# Patient Record
Sex: Female | Born: 1950 | Race: White | Hispanic: No | Marital: Married | Smoking: Never smoker
Health system: Southern US, Community
[De-identification: ages and names within clinical notes are randomized; demographics above are authoritative.]

## PROBLEM LIST (undated history)

## (undated) DIAGNOSIS — N189 Chronic kidney disease, unspecified: Secondary | ICD-10-CM

## (undated) DIAGNOSIS — K219 Gastro-esophageal reflux disease without esophagitis: Secondary | ICD-10-CM

## (undated) DIAGNOSIS — I10 Essential (primary) hypertension: Secondary | ICD-10-CM

## (undated) DIAGNOSIS — R202 Paresthesia of skin: Secondary | ICD-10-CM

## (undated) DIAGNOSIS — K589 Irritable bowel syndrome without diarrhea: Secondary | ICD-10-CM

## (undated) DIAGNOSIS — M5030 Other cervical disc degeneration, unspecified cervical region: Secondary | ICD-10-CM

## (undated) DIAGNOSIS — M5430 Sciatica, unspecified side: Secondary | ICD-10-CM

## (undated) DIAGNOSIS — I700 Atherosclerosis of aorta: Secondary | ICD-10-CM

## (undated) DIAGNOSIS — R413 Other amnesia: Secondary | ICD-10-CM

## (undated) DIAGNOSIS — K5790 Diverticulosis of intestine, part unspecified, without perforation or abscess without bleeding: Secondary | ICD-10-CM

## (undated) DIAGNOSIS — M51369 Other intervertebral disc degeneration, lumbar region without mention of lumbar back pain or lower extremity pain: Secondary | ICD-10-CM

## (undated) DIAGNOSIS — R42 Dizziness and giddiness: Secondary | ICD-10-CM

## (undated) DIAGNOSIS — F32A Depression, unspecified: Secondary | ICD-10-CM

## (undated) DIAGNOSIS — M5136 Other intervertebral disc degeneration, lumbar region: Secondary | ICD-10-CM

## (undated) DIAGNOSIS — E785 Hyperlipidemia, unspecified: Secondary | ICD-10-CM

## (undated) HISTORY — PX: DILATION AND CURETTAGE OF UTERUS: SHX78

## (undated) HISTORY — PX: FETAL SURGERY FOR CONGENITAL HERNIA: SHX1618

## (undated) HISTORY — DX: Other cervical disc degeneration, unspecified cervical region: M50.30

## (undated) HISTORY — DX: Gastro-esophageal reflux disease without esophagitis: K21.9

## (undated) HISTORY — DX: Chronic kidney disease, unspecified: N18.9

## (undated) HISTORY — DX: Other amnesia: R41.3

## (undated) HISTORY — DX: Essential (primary) hypertension: I10

## (undated) HISTORY — DX: Atherosclerosis of aorta: I70.0

## (undated) HISTORY — DX: Sciatica, unspecified side: M54.30

## (undated) HISTORY — DX: Dizziness and giddiness: R42

## (undated) HISTORY — DX: Other intervertebral disc degeneration, lumbar region without mention of lumbar back pain or lower extremity pain: M51.369

## (undated) HISTORY — DX: Hyperlipidemia, unspecified: E78.5

## (undated) HISTORY — DX: Paresthesia of skin: R20.2

## (undated) HISTORY — DX: Diverticulosis of intestine, part unspecified, without perforation or abscess without bleeding: K57.90

## (undated) HISTORY — DX: Other intervertebral disc degeneration, lumbar region: M51.36

## (undated) HISTORY — DX: Irritable bowel syndrome, unspecified: K58.9

## (undated) HISTORY — DX: Depression, unspecified: F32.A

---

## 2006-05-15 ENCOUNTER — Encounter: Admission: RE | Admit: 2006-05-15 | Discharge: 2006-05-15 | Payer: Self-pay | Admitting: Family Medicine

## 2007-10-15 IMAGING — US UNKNOWN US STUDY
1 series · 2 of 2 positions shown · non-contrast
Comparison: none

LEFT BREAST ULTRASOUND

LEFT BREAST ULTRASOUND:
CLINICAL DATA: Outside ultrasound at [facility]] performed on 11-14-05 described 
prominent nodular fibroglandular tissue and slight increased distortion at 7 o'clock in the left 
breast.  Report states that this was felt to represent "glandularity."  Six-month follow-up 
sonography was suggested.
On physical examination, no mass is palpated in the left lower inner quadrant.  Sonography 
demonstrates dense fibroglandular tissue with no mass, distortion or shadowing to suggest 
malignancy.

[Series 1: unknown us study · 2 of 2 slices shown]
[im 1/2]
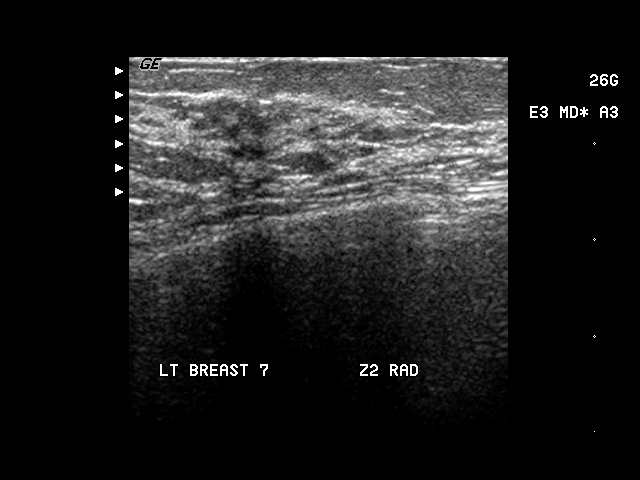
[im 2/2]
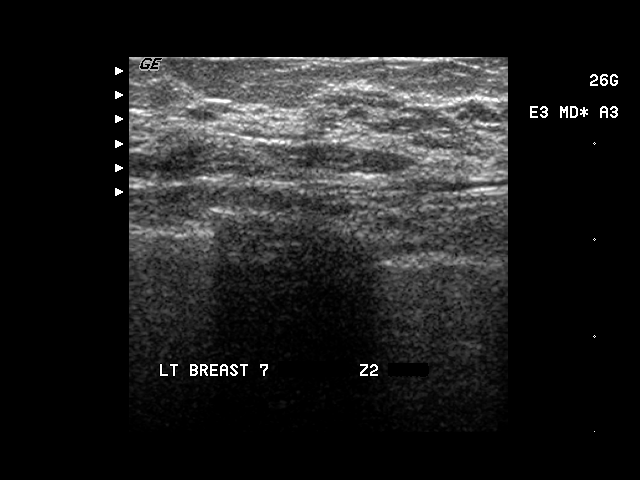

[2 of 2 positions shown; findings below may reference images not displayed]

IMPRESSION: No sonographic evidence of malignancy.  Yearly screening mammography is suggested with next 
scheduled exam in November 2006.

ASSESSMENT: Negative - BI-RADS 1

Screening mammogram of both breasts in 6 months.
,

## 2009-03-05 HISTORY — PX: APPENDECTOMY: SHX54

## 2009-08-03 ENCOUNTER — Observation Stay (HOSPITAL_COMMUNITY): Admission: EM | Admit: 2009-08-03 | Discharge: 2009-08-04 | Payer: Self-pay | Admitting: General Surgery

## 2010-05-22 LAB — CBC
Hemoglobin: 12.4 g/dL (ref 12.0–15.0)
RBC: 3.83 MIL/uL — ABNORMAL LOW (ref 3.87–5.11)
WBC: 6.1 10*3/uL (ref 4.0–10.5)

## 2010-05-22 LAB — DIFFERENTIAL
Lymphs Abs: 1.2 10*3/uL (ref 0.7–4.0)
Monocytes Relative: 11 % (ref 3–12)
Neutro Abs: 4.1 10*3/uL (ref 1.7–7.7)
Neutrophils Relative %: 67 % (ref 43–77)

## 2010-05-22 LAB — BASIC METABOLIC PANEL
Calcium: 9.2 mg/dL (ref 8.4–10.5)
Chloride: 104 mEq/L (ref 96–112)
Creatinine, Ser: 1.1 mg/dL (ref 0.4–1.2)
GFR calc Af Amer: >60 mL/min (ref >60)
Sodium: 137 mEq/L (ref 135–145)

## 2012-12-12 HISTORY — PX: COLONOSCOPY: SHX174

## 2018-01-24 ENCOUNTER — Other Ambulatory Visit (HOSPITAL_COMMUNITY): Payer: Self-pay | Admitting: Physician Assistant

## 2018-01-24 DIAGNOSIS — R0989 Other specified symptoms and signs involving the circulatory and respiratory systems: Secondary | ICD-10-CM

## 2018-01-29 ENCOUNTER — Ambulatory Visit (HOSPITAL_COMMUNITY): Payer: Self-pay

## 2018-01-31 ENCOUNTER — Ambulatory Visit (HOSPITAL_COMMUNITY): Payer: Self-pay

## 2018-02-03 ENCOUNTER — Ambulatory Visit (HOSPITAL_COMMUNITY)
Admission: RE | Admit: 2018-02-03 | Discharge: 2018-02-03 | Disposition: A | Discharge status: Home / Self Care | Payer: Medicare Other | Source: Ambulatory Visit | Attending: Physician Assistant | Admitting: Physician Assistant

## 2018-02-03 DIAGNOSIS — R0989 Other specified symptoms and signs involving the circulatory and respiratory systems: Secondary | ICD-10-CM | POA: Diagnosis present

## 2019-07-06 IMAGING — US BILATERAL CAROTID DUPLEX ULTRASOUND
1 series · 13 of 24 positions shown · non-contrast
Comparison: None.

CLINICAL DATA: 67-year-old female with a history of right carotid
bruit

EXAM:
BILATERAL CAROTID DUPLEX ULTRASOUND
TECHNIQUE: Gray scale imaging, color Doppler and duplex ultrasound were
performed of bilateral carotid and vertebral arteries in the neck.

[Series 1: bilateral carotid duplex ultrasound · 0.05mm/px · 13 of 73 slices shown]
[im 1/73]
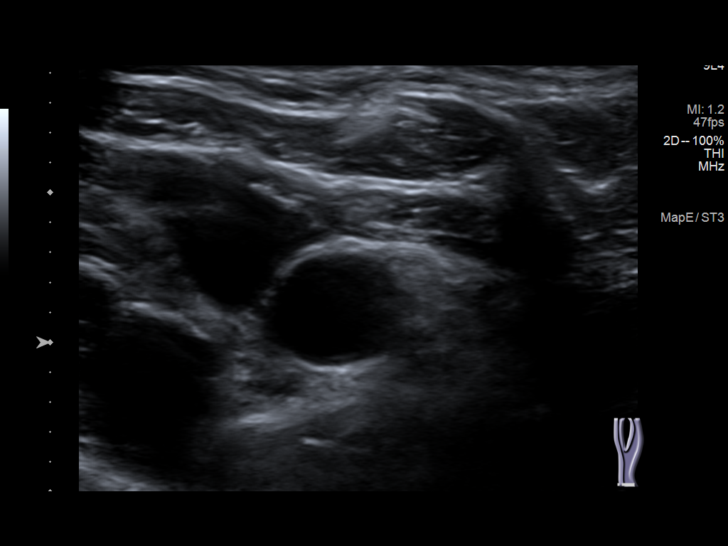
[im 7/73]
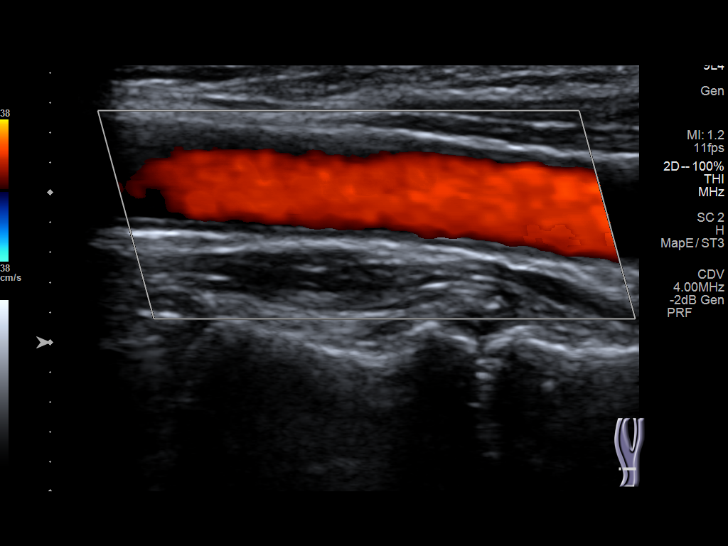
[im 13/73]
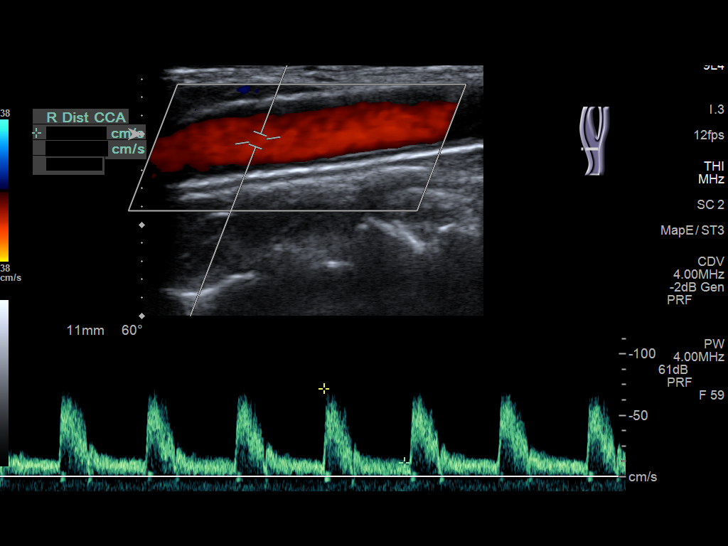
[im 19/73]
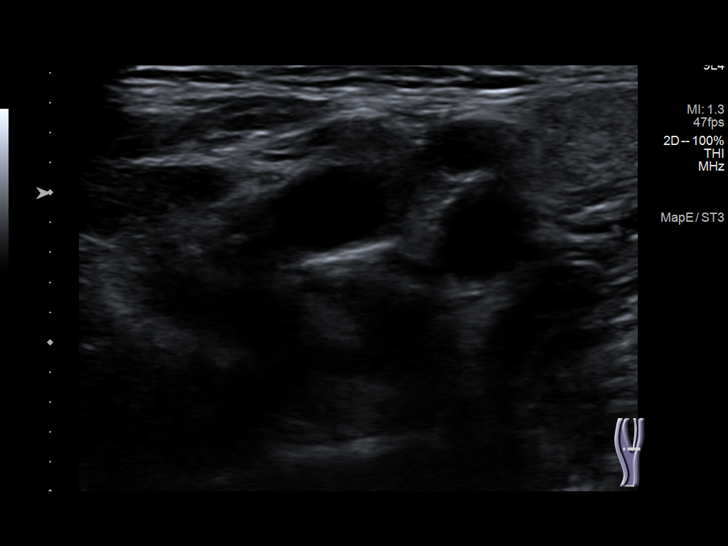
[im 26/73]
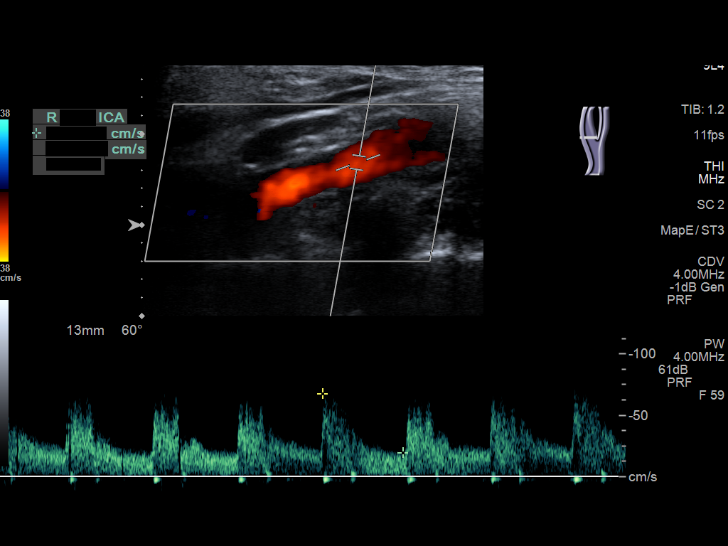
[im 32/73]
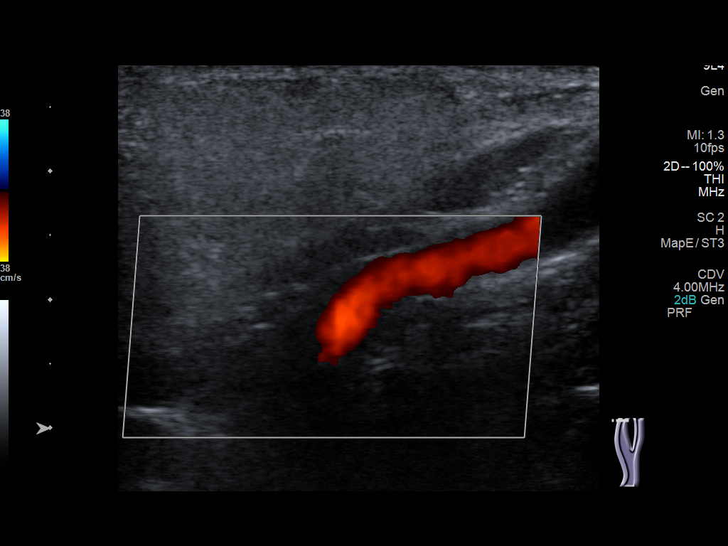
[im 38/73]
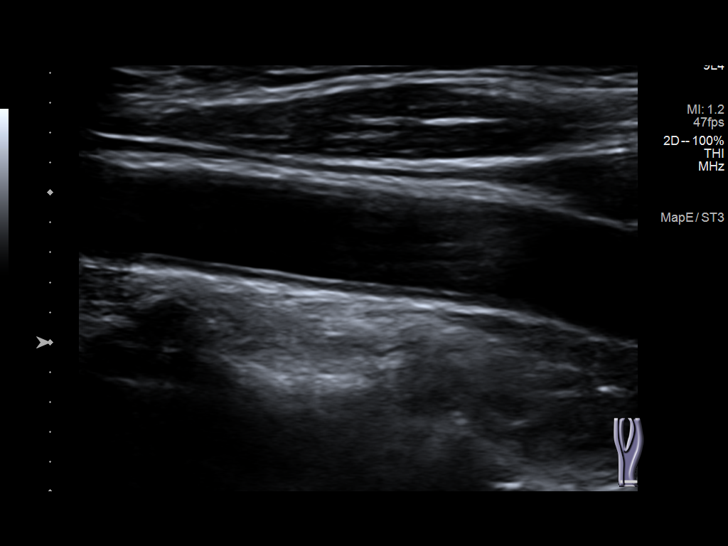
[im 41/73]
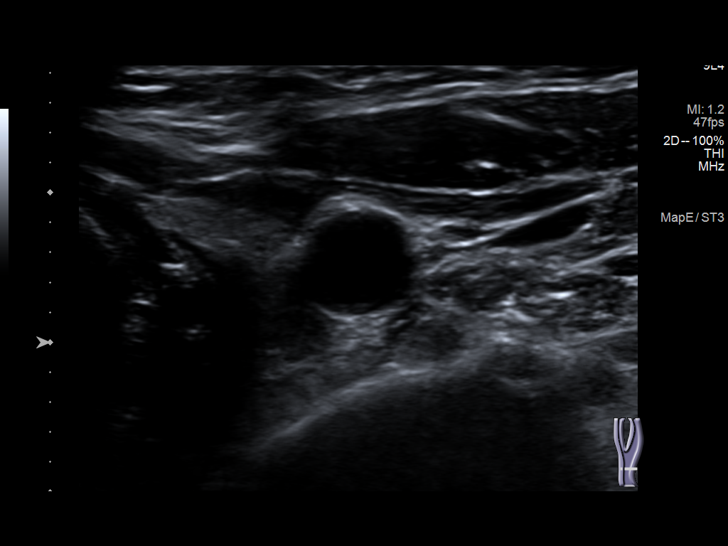
[im 47/73]
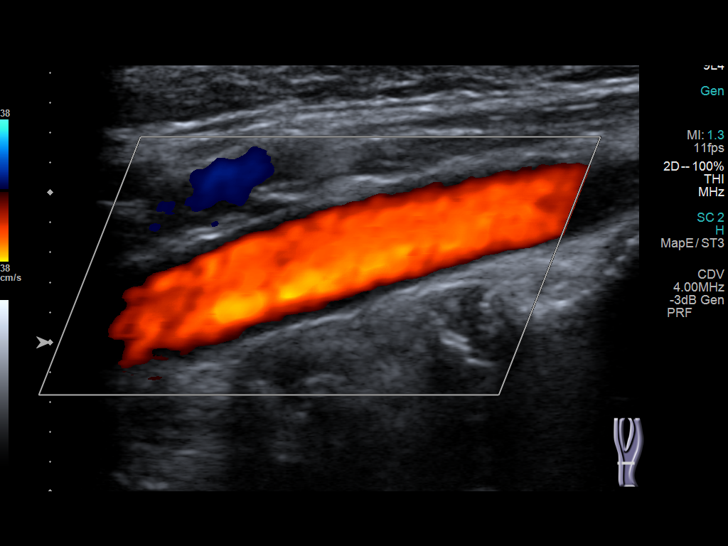
[im 54/73]
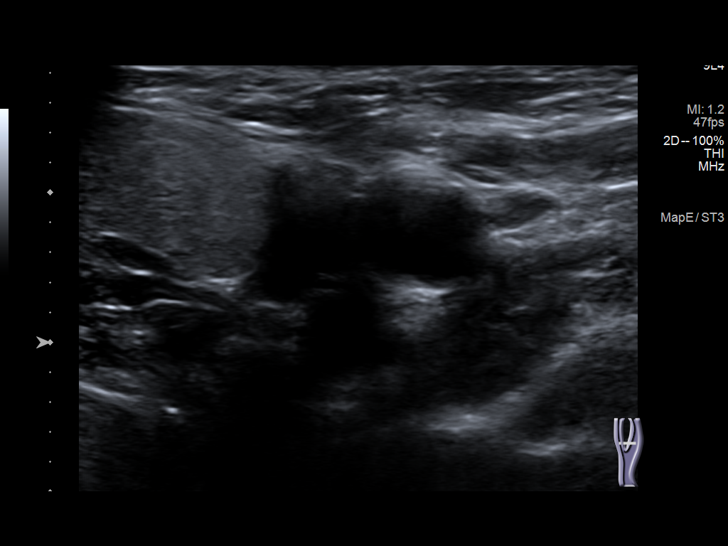
[im 60/73]
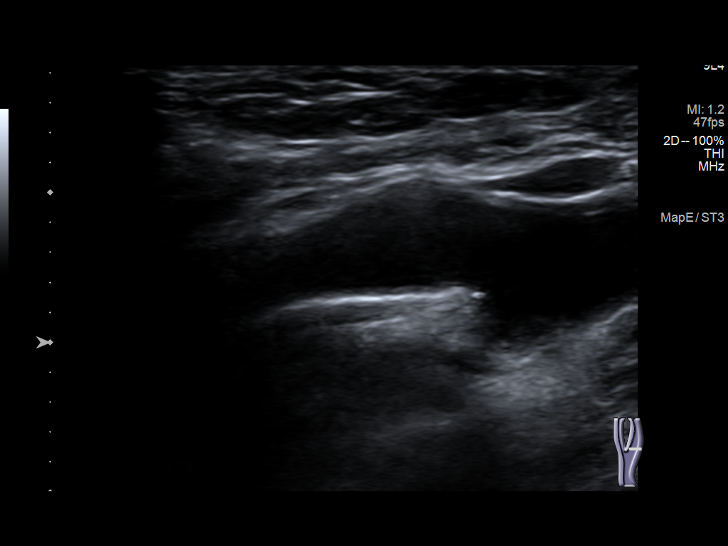
[im 66/73]
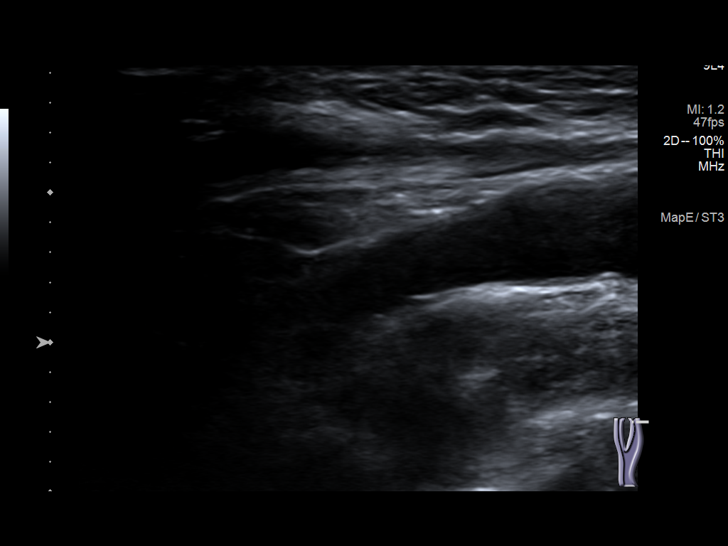
[im 73/73]
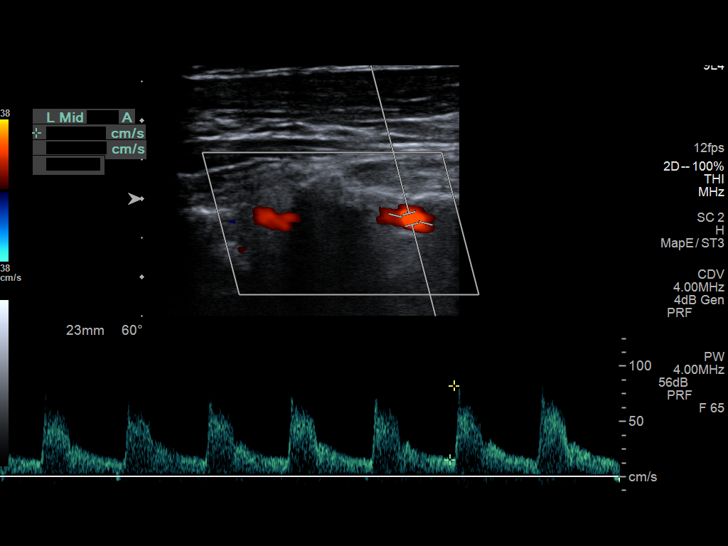

[13 of 24 positions shown; findings below may reference images not displayed]

FINDINGS: Criteria: Quantification of carotid stenosis is based on velocity
parameters that correlate the residual internal carotid diameter
with NASCET-based stenosis levels, using the diameter of the distal
internal carotid lumen as the denominator for stenosis measurement.

The following velocity measurements were obtained:

RIGHT

ICA:  Systolic 100 cm/sec, Diastolic 28 cm/sec

CCA:  109 cm/sec

SYSTOLIC ICA/CCA RATIO:

ECA:  79 cm/sec

LEFT

ICA:  Systolic 112 cm/sec, Diastolic 35 cm/sec

CCA:  96 cm/sec

SYSTOLIC ICA/CCA RATIO:

ECA:  63 cm/sec

Right Brachial SBP: Not acquired

Left Brachial SBP: Not acquired

RIGHT CAROTID ARTERY: No significant calcified disease of the right
common carotid artery. Intermediate waveform maintained.
Heterogeneous plaque without significant calcifications at the right
carotid bifurcation. Low resistance waveform of the right ICA. No
significant tortuosity.

RIGHT VERTEBRAL ARTERY: Antegrade flow with low resistance waveform.

LEFT CAROTID ARTERY: No significant calcified disease of the left
common carotid artery. Intermediate waveform maintained.
Heterogeneous plaque at the left carotid bifurcation without
significant calcifications. Low resistance waveform of the left ICA.

LEFT VERTEBRAL ARTERY:  Antegrade flow with low resistance waveform.
IMPRESSION: Color duplex indicates minimal heterogeneous plaque, with no
hemodynamically significant stenosis by duplex criteria in the
extracranial cerebrovascular circulation.

## 2020-07-25 ENCOUNTER — Ambulatory Visit: Payer: Medicare Other | Admitting: Nutrition

## 2020-08-30 ENCOUNTER — Ambulatory Visit: Payer: Self-pay | Admitting: Nutrition

## 2020-09-13 ENCOUNTER — Ambulatory Visit: Payer: Self-pay | Admitting: Nutrition

## 2020-09-13 DIAGNOSIS — M25461 Effusion, right knee: Secondary | ICD-10-CM | POA: Diagnosis not present

## 2020-09-13 DIAGNOSIS — Z6824 Body mass index (BMI) 24.0-24.9, adult: Secondary | ICD-10-CM | POA: Diagnosis not present

## 2020-09-13 DIAGNOSIS — S83241A Other tear of medial meniscus, current injury, right knee, initial encounter: Secondary | ICD-10-CM | POA: Diagnosis not present

## 2020-09-15 DIAGNOSIS — J029 Acute pharyngitis, unspecified: Secondary | ICD-10-CM | POA: Diagnosis not present

## 2020-09-15 DIAGNOSIS — Z20828 Contact with and (suspected) exposure to other viral communicable diseases: Secondary | ICD-10-CM | POA: Diagnosis not present

## 2020-09-23 DIAGNOSIS — S83241A Other tear of medial meniscus, current injury, right knee, initial encounter: Secondary | ICD-10-CM | POA: Diagnosis not present

## 2020-09-23 DIAGNOSIS — M1711 Unilateral primary osteoarthritis, right knee: Secondary | ICD-10-CM | POA: Diagnosis not present

## 2020-09-23 DIAGNOSIS — M7989 Other specified soft tissue disorders: Secondary | ICD-10-CM | POA: Diagnosis not present

## 2020-09-23 DIAGNOSIS — M23221 Derangement of posterior horn of medial meniscus due to old tear or injury, right knee: Secondary | ICD-10-CM | POA: Diagnosis not present

## 2020-09-29 DIAGNOSIS — N950 Postmenopausal bleeding: Secondary | ICD-10-CM | POA: Diagnosis not present

## 2020-10-05 DIAGNOSIS — M25561 Pain in right knee: Secondary | ICD-10-CM | POA: Diagnosis not present

## 2020-10-05 DIAGNOSIS — M1711 Unilateral primary osteoarthritis, right knee: Secondary | ICD-10-CM | POA: Diagnosis not present

## 2020-10-05 DIAGNOSIS — M23206 Derangement of unspecified meniscus due to old tear or injury, right knee: Secondary | ICD-10-CM | POA: Diagnosis not present

## 2020-10-12 ENCOUNTER — Encounter: Payer: Self-pay | Admitting: Nutrition

## 2020-10-12 ENCOUNTER — Other Ambulatory Visit: Payer: Self-pay

## 2020-10-12 ENCOUNTER — Encounter: Payer: Medicare PPO | Attending: Family Medicine | Admitting: Nutrition

## 2020-10-12 VITALS — Ht 62.0 in | Wt 132.0 lb

## 2020-10-12 DIAGNOSIS — N1831 Chronic kidney disease, stage 3a: Secondary | ICD-10-CM | POA: Diagnosis not present

## 2020-10-12 DIAGNOSIS — E782 Mixed hyperlipidemia: Secondary | ICD-10-CM | POA: Diagnosis not present

## 2020-10-12 DIAGNOSIS — N183 Chronic kidney disease, stage 3 unspecified: Secondary | ICD-10-CM

## 2020-10-12 NOTE — Patient Instructions (Signed)
Goals  Follow Low Potassium High Fiber diet Read food labels. Drink 100 oz of water per day Drink water Limit sodium intake to 1800 mg a day Don't skip meals Will follow up on potassium and kidney and other lab results next visit.

## 2020-10-12 NOTE — Progress Notes (Signed)
Medical Nutrition Therapy  Appointment Start time:  1300  Appointment End time:  1400  Primary concerns today: CKD, Hyperlipidemia Referral diagnosis: N18.3, N78.3 Preferred learning style: no preference  Learning readiness: ready    NUTRITION ASSESSMENT  Has stopped drinking sodas and drinks only water and some cranberrry juice. Drinks 3-4 20 oz of bottles of water per day Sees  Family history of CKD in her dad and brother. She cares for her brother. Anthropometrics  Wt Readings from Last 3 Encounters:  10/12/20 132 lb (59.9 kg)   Ht Readings from Last 3 Encounters:  10/12/20 5\' 2"  (1.575 m)   Body mass index is 24.14 kg/m. @BMIFA @ Facility age limit for growth percentiles is 20 years. Facility age limit for growth percentiles is 20 years.  Clinical Medical Hx: CKD, Hyperlipidemia Medications: see chart Labs: requested from PCP Notable Signs/Symptoms: none  Lifestyle & Dietary Hx LIves with her husband. Doesn't cook a lot has been ordering some healthy meals on line for delivery. Eats 2 meals per day  Estimated daily fluid intake: 48 oz Supplements: none Sleep: 8 hrs Stress / self-care: has stress from family stress  Current average weekly physical activity: Active with line dancing   24-Hr Dietary Recall First Meal: coffee, sausage balls or  scrambled eggs, toast or english muffin Snack:  Second Meal: skips  Snack:  Third Meal: usually eats earlier:  Everyplate meals delivered/ordered Snack:  Beverages: water  Estimated Energy Needs Calories: 1200-1500 Carbohydrate: 170g Protein: 112g Fat: 42g   NUTRITION DIAGNOSIS  [id number] Excessive mineral intake (specify): Potassium  As related to CKD.  As evidenced by Potassium 6.5%.   NUTRITION INTERVENTION  Nutrition education (E-1) on the following topics:  Chronic kidney disease-low sodium diet My Plate-plant based foods Reading food labels Importance of eating three meals per day High Fiber  diet.  Handouts Provided Include  My Plate Nutrition Therapy for CKD High Fiber diet Low Salt information  Learning Style & Readiness for Change Teaching method utilized: Visual & Auditory  Demonstrated degree of understanding via: Teach Back  Barriers to learning/adherence to lifestyle change: none  Goals  Follow Low Potassium High Fiber diet Read food labels. Drink 100 oz of water per day Drink water Limit sodium intake to 1800 mg a day Don't skip meals Will follow up on potassium and kidney and other lab results next visit.   MONITORING & EVALUATION Dietary intake, weekly physical activity, and potassium and lipid levels in 3 months.  Next Steps  Patient is to work on planning meals and reducing potassium intake.

## 2020-10-24 DIAGNOSIS — M1711 Unilateral primary osteoarthritis, right knee: Secondary | ICD-10-CM | POA: Diagnosis not present

## 2020-10-24 DIAGNOSIS — M23206 Derangement of unspecified meniscus due to old tear or injury, right knee: Secondary | ICD-10-CM | POA: Diagnosis not present

## 2020-10-24 DIAGNOSIS — M25561 Pain in right knee: Secondary | ICD-10-CM | POA: Diagnosis not present

## 2020-10-25 DIAGNOSIS — Z01818 Encounter for other preprocedural examination: Secondary | ICD-10-CM | POA: Diagnosis not present

## 2020-10-26 DIAGNOSIS — K219 Gastro-esophageal reflux disease without esophagitis: Secondary | ICD-10-CM | POA: Diagnosis not present

## 2020-10-26 DIAGNOSIS — N950 Postmenopausal bleeding: Secondary | ICD-10-CM | POA: Diagnosis not present

## 2020-10-26 DIAGNOSIS — I129 Hypertensive chronic kidney disease with stage 1 through stage 4 chronic kidney disease, or unspecified chronic kidney disease: Secondary | ICD-10-CM | POA: Diagnosis not present

## 2020-10-26 DIAGNOSIS — E785 Hyperlipidemia, unspecified: Secondary | ICD-10-CM | POA: Diagnosis not present

## 2020-10-26 DIAGNOSIS — M797 Fibromyalgia: Secondary | ICD-10-CM | POA: Diagnosis not present

## 2020-10-26 DIAGNOSIS — Z800 Family history of malignant neoplasm of digestive organs: Secondary | ICD-10-CM | POA: Diagnosis not present

## 2020-10-26 DIAGNOSIS — N854 Malposition of uterus: Secondary | ICD-10-CM | POA: Diagnosis not present

## 2020-10-26 DIAGNOSIS — N189 Chronic kidney disease, unspecified: Secondary | ICD-10-CM | POA: Diagnosis not present

## 2020-11-02 DIAGNOSIS — E7801 Familial hypercholesterolemia: Secondary | ICD-10-CM | POA: Diagnosis not present

## 2020-11-02 DIAGNOSIS — K219 Gastro-esophageal reflux disease without esophagitis: Secondary | ICD-10-CM | POA: Diagnosis not present

## 2020-11-02 DIAGNOSIS — E782 Mixed hyperlipidemia: Secondary | ICD-10-CM | POA: Diagnosis not present

## 2020-11-02 DIAGNOSIS — E7849 Other hyperlipidemia: Secondary | ICD-10-CM | POA: Diagnosis not present

## 2020-11-02 DIAGNOSIS — E7800 Pure hypercholesterolemia, unspecified: Secondary | ICD-10-CM | POA: Diagnosis not present

## 2020-11-02 DIAGNOSIS — Z1329 Encounter for screening for other suspected endocrine disorder: Secondary | ICD-10-CM | POA: Diagnosis not present

## 2020-11-02 DIAGNOSIS — I10 Essential (primary) hypertension: Secondary | ICD-10-CM | POA: Diagnosis not present

## 2020-11-09 DIAGNOSIS — Z6825 Body mass index (BMI) 25.0-25.9, adult: Secondary | ICD-10-CM | POA: Diagnosis not present

## 2020-11-09 DIAGNOSIS — R600 Localized edema: Secondary | ICD-10-CM | POA: Diagnosis not present

## 2020-11-09 DIAGNOSIS — N939 Abnormal uterine and vaginal bleeding, unspecified: Secondary | ICD-10-CM | POA: Diagnosis not present

## 2020-11-09 DIAGNOSIS — I10 Essential (primary) hypertension: Secondary | ICD-10-CM | POA: Diagnosis not present

## 2020-11-09 DIAGNOSIS — S83241A Other tear of medial meniscus, current injury, right knee, initial encounter: Secondary | ICD-10-CM | POA: Diagnosis not present

## 2020-11-09 DIAGNOSIS — E7849 Other hyperlipidemia: Secondary | ICD-10-CM | POA: Diagnosis not present

## 2020-11-09 DIAGNOSIS — E875 Hyperkalemia: Secondary | ICD-10-CM | POA: Diagnosis not present

## 2021-02-06 ENCOUNTER — Ambulatory Visit: Payer: Medicare PPO | Admitting: Nutrition

## 2021-02-07 DIAGNOSIS — E559 Vitamin D deficiency, unspecified: Secondary | ICD-10-CM | POA: Diagnosis not present

## 2021-02-07 DIAGNOSIS — E7800 Pure hypercholesterolemia, unspecified: Secondary | ICD-10-CM | POA: Diagnosis not present

## 2021-02-07 DIAGNOSIS — E7801 Familial hypercholesterolemia: Secondary | ICD-10-CM | POA: Diagnosis not present

## 2021-02-07 DIAGNOSIS — E782 Mixed hyperlipidemia: Secondary | ICD-10-CM | POA: Diagnosis not present

## 2021-02-07 DIAGNOSIS — I10 Essential (primary) hypertension: Secondary | ICD-10-CM | POA: Diagnosis not present

## 2021-02-07 DIAGNOSIS — D519 Vitamin B12 deficiency anemia, unspecified: Secondary | ICD-10-CM | POA: Diagnosis not present

## 2021-02-07 DIAGNOSIS — E7849 Other hyperlipidemia: Secondary | ICD-10-CM | POA: Diagnosis not present

## 2021-02-14 DIAGNOSIS — I10 Essential (primary) hypertension: Secondary | ICD-10-CM | POA: Diagnosis not present

## 2021-02-14 DIAGNOSIS — E7849 Other hyperlipidemia: Secondary | ICD-10-CM | POA: Diagnosis not present

## 2021-02-14 DIAGNOSIS — N939 Abnormal uterine and vaginal bleeding, unspecified: Secondary | ICD-10-CM | POA: Diagnosis not present

## 2021-02-14 DIAGNOSIS — K580 Irritable bowel syndrome with diarrhea: Secondary | ICD-10-CM | POA: Diagnosis not present

## 2021-02-14 DIAGNOSIS — E875 Hyperkalemia: Secondary | ICD-10-CM | POA: Diagnosis not present

## 2021-02-22 DIAGNOSIS — M9902 Segmental and somatic dysfunction of thoracic region: Secondary | ICD-10-CM | POA: Diagnosis not present

## 2021-02-22 DIAGNOSIS — M9905 Segmental and somatic dysfunction of pelvic region: Secondary | ICD-10-CM | POA: Diagnosis not present

## 2021-02-22 DIAGNOSIS — M5441 Lumbago with sciatica, right side: Secondary | ICD-10-CM | POA: Diagnosis not present

## 2021-02-22 DIAGNOSIS — M9903 Segmental and somatic dysfunction of lumbar region: Secondary | ICD-10-CM | POA: Diagnosis not present

## 2021-03-10 DIAGNOSIS — M9903 Segmental and somatic dysfunction of lumbar region: Secondary | ICD-10-CM | POA: Diagnosis not present

## 2021-03-10 DIAGNOSIS — M9902 Segmental and somatic dysfunction of thoracic region: Secondary | ICD-10-CM | POA: Diagnosis not present

## 2021-03-10 DIAGNOSIS — M9905 Segmental and somatic dysfunction of pelvic region: Secondary | ICD-10-CM | POA: Diagnosis not present

## 2021-03-10 DIAGNOSIS — M5441 Lumbago with sciatica, right side: Secondary | ICD-10-CM | POA: Diagnosis not present

## 2021-03-20 DIAGNOSIS — M5441 Lumbago with sciatica, right side: Secondary | ICD-10-CM | POA: Diagnosis not present

## 2021-03-20 DIAGNOSIS — M9902 Segmental and somatic dysfunction of thoracic region: Secondary | ICD-10-CM | POA: Diagnosis not present

## 2021-03-20 DIAGNOSIS — M9905 Segmental and somatic dysfunction of pelvic region: Secondary | ICD-10-CM | POA: Diagnosis not present

## 2021-03-20 DIAGNOSIS — M9903 Segmental and somatic dysfunction of lumbar region: Secondary | ICD-10-CM | POA: Diagnosis not present

## 2021-04-17 DIAGNOSIS — H52223 Regular astigmatism, bilateral: Secondary | ICD-10-CM | POA: Diagnosis not present

## 2021-04-17 DIAGNOSIS — M9903 Segmental and somatic dysfunction of lumbar region: Secondary | ICD-10-CM | POA: Diagnosis not present

## 2021-04-17 DIAGNOSIS — M9905 Segmental and somatic dysfunction of pelvic region: Secondary | ICD-10-CM | POA: Diagnosis not present

## 2021-04-17 DIAGNOSIS — H2513 Age-related nuclear cataract, bilateral: Secondary | ICD-10-CM | POA: Diagnosis not present

## 2021-04-17 DIAGNOSIS — M5441 Lumbago with sciatica, right side: Secondary | ICD-10-CM | POA: Diagnosis not present

## 2021-04-17 DIAGNOSIS — H524 Presbyopia: Secondary | ICD-10-CM | POA: Diagnosis not present

## 2021-04-17 DIAGNOSIS — H5203 Hypermetropia, bilateral: Secondary | ICD-10-CM | POA: Diagnosis not present

## 2021-04-17 DIAGNOSIS — M9902 Segmental and somatic dysfunction of thoracic region: Secondary | ICD-10-CM | POA: Diagnosis not present

## 2021-04-17 DIAGNOSIS — H16223 Keratoconjunctivitis sicca, not specified as Sjogren's, bilateral: Secondary | ICD-10-CM | POA: Diagnosis not present

## 2021-05-05 DIAGNOSIS — R739 Hyperglycemia, unspecified: Secondary | ICD-10-CM | POA: Diagnosis not present

## 2021-05-09 DIAGNOSIS — Z6824 Body mass index (BMI) 24.0-24.9, adult: Secondary | ICD-10-CM | POA: Diagnosis not present

## 2021-05-09 DIAGNOSIS — E875 Hyperkalemia: Secondary | ICD-10-CM | POA: Diagnosis not present

## 2021-05-09 DIAGNOSIS — I700 Atherosclerosis of aorta: Secondary | ICD-10-CM | POA: Diagnosis not present

## 2021-05-09 DIAGNOSIS — I10 Essential (primary) hypertension: Secondary | ICD-10-CM | POA: Diagnosis not present

## 2021-05-09 DIAGNOSIS — N183 Chronic kidney disease, stage 3 unspecified: Secondary | ICD-10-CM | POA: Diagnosis not present

## 2021-05-12 DIAGNOSIS — D519 Vitamin B12 deficiency anemia, unspecified: Secondary | ICD-10-CM | POA: Diagnosis not present

## 2021-05-12 DIAGNOSIS — E7849 Other hyperlipidemia: Secondary | ICD-10-CM | POA: Diagnosis not present

## 2021-05-12 DIAGNOSIS — N183 Chronic kidney disease, stage 3 unspecified: Secondary | ICD-10-CM | POA: Diagnosis not present

## 2021-05-12 DIAGNOSIS — Z0001 Encounter for general adult medical examination with abnormal findings: Secondary | ICD-10-CM | POA: Diagnosis not present

## 2021-05-12 DIAGNOSIS — I700 Atherosclerosis of aorta: Secondary | ICD-10-CM | POA: Diagnosis not present

## 2021-05-12 DIAGNOSIS — I10 Essential (primary) hypertension: Secondary | ICD-10-CM | POA: Diagnosis not present

## 2021-06-19 DIAGNOSIS — M9903 Segmental and somatic dysfunction of lumbar region: Secondary | ICD-10-CM | POA: Diagnosis not present

## 2021-06-19 DIAGNOSIS — M5441 Lumbago with sciatica, right side: Secondary | ICD-10-CM | POA: Diagnosis not present

## 2021-06-19 DIAGNOSIS — M9905 Segmental and somatic dysfunction of pelvic region: Secondary | ICD-10-CM | POA: Diagnosis not present

## 2021-06-19 DIAGNOSIS — M9902 Segmental and somatic dysfunction of thoracic region: Secondary | ICD-10-CM | POA: Diagnosis not present

## 2021-07-03 DIAGNOSIS — Z1231 Encounter for screening mammogram for malignant neoplasm of breast: Secondary | ICD-10-CM | POA: Diagnosis not present

## 2021-07-24 DIAGNOSIS — E7849 Other hyperlipidemia: Secondary | ICD-10-CM | POA: Diagnosis not present

## 2021-07-24 DIAGNOSIS — R413 Other amnesia: Secondary | ICD-10-CM | POA: Diagnosis not present

## 2021-07-24 DIAGNOSIS — G629 Polyneuropathy, unspecified: Secondary | ICD-10-CM | POA: Diagnosis not present

## 2021-07-24 DIAGNOSIS — I10 Essential (primary) hypertension: Secondary | ICD-10-CM | POA: Diagnosis not present

## 2021-07-24 DIAGNOSIS — Z6824 Body mass index (BMI) 24.0-24.9, adult: Secondary | ICD-10-CM | POA: Diagnosis not present

## 2021-07-24 DIAGNOSIS — I700 Atherosclerosis of aorta: Secondary | ICD-10-CM | POA: Diagnosis not present

## 2021-07-24 DIAGNOSIS — R296 Repeated falls: Secondary | ICD-10-CM | POA: Diagnosis not present

## 2021-07-24 DIAGNOSIS — K580 Irritable bowel syndrome with diarrhea: Secondary | ICD-10-CM | POA: Diagnosis not present

## 2021-08-08 DIAGNOSIS — E7801 Familial hypercholesterolemia: Secondary | ICD-10-CM | POA: Diagnosis not present

## 2021-08-08 DIAGNOSIS — E559 Vitamin D deficiency, unspecified: Secondary | ICD-10-CM | POA: Diagnosis not present

## 2021-08-08 DIAGNOSIS — D519 Vitamin B12 deficiency anemia, unspecified: Secondary | ICD-10-CM | POA: Diagnosis not present

## 2021-08-08 DIAGNOSIS — E7849 Other hyperlipidemia: Secondary | ICD-10-CM | POA: Diagnosis not present

## 2021-08-08 DIAGNOSIS — E7800 Pure hypercholesterolemia, unspecified: Secondary | ICD-10-CM | POA: Diagnosis not present

## 2021-08-08 DIAGNOSIS — R739 Hyperglycemia, unspecified: Secondary | ICD-10-CM | POA: Diagnosis not present

## 2021-08-08 DIAGNOSIS — R413 Other amnesia: Secondary | ICD-10-CM | POA: Diagnosis not present

## 2021-08-08 DIAGNOSIS — Z1329 Encounter for screening for other suspected endocrine disorder: Secondary | ICD-10-CM | POA: Diagnosis not present

## 2021-08-08 DIAGNOSIS — I6789 Other cerebrovascular disease: Secondary | ICD-10-CM | POA: Diagnosis not present

## 2021-08-08 DIAGNOSIS — E782 Mixed hyperlipidemia: Secondary | ICD-10-CM | POA: Diagnosis not present

## 2021-08-11 DIAGNOSIS — E7849 Other hyperlipidemia: Secondary | ICD-10-CM | POA: Diagnosis not present

## 2021-08-11 DIAGNOSIS — I10 Essential (primary) hypertension: Secondary | ICD-10-CM | POA: Diagnosis not present

## 2021-08-11 DIAGNOSIS — Z7189 Other specified counseling: Secondary | ICD-10-CM | POA: Diagnosis not present

## 2021-08-11 DIAGNOSIS — I700 Atherosclerosis of aorta: Secondary | ICD-10-CM | POA: Diagnosis not present

## 2021-08-11 DIAGNOSIS — G629 Polyneuropathy, unspecified: Secondary | ICD-10-CM | POA: Diagnosis not present

## 2021-08-11 DIAGNOSIS — Z6824 Body mass index (BMI) 24.0-24.9, adult: Secondary | ICD-10-CM | POA: Diagnosis not present

## 2021-08-11 DIAGNOSIS — N183 Chronic kidney disease, stage 3 unspecified: Secondary | ICD-10-CM | POA: Diagnosis not present

## 2021-08-11 DIAGNOSIS — R413 Other amnesia: Secondary | ICD-10-CM | POA: Diagnosis not present

## 2021-08-21 DIAGNOSIS — M9905 Segmental and somatic dysfunction of pelvic region: Secondary | ICD-10-CM | POA: Diagnosis not present

## 2021-08-21 DIAGNOSIS — M5441 Lumbago with sciatica, right side: Secondary | ICD-10-CM | POA: Diagnosis not present

## 2021-08-21 DIAGNOSIS — M9902 Segmental and somatic dysfunction of thoracic region: Secondary | ICD-10-CM | POA: Diagnosis not present

## 2021-08-21 DIAGNOSIS — M9903 Segmental and somatic dysfunction of lumbar region: Secondary | ICD-10-CM | POA: Diagnosis not present

## 2021-09-04 ENCOUNTER — Telehealth: Payer: Self-pay | Admitting: Diagnostic Neuroimaging

## 2021-09-04 NOTE — Telephone Encounter (Signed)
LVM and sent mychart msg informing pt of r/s needed for 7/10 appt- MD out.

## 2021-09-11 ENCOUNTER — Ambulatory Visit: Payer: Medicare PPO | Admitting: Diagnostic Neuroimaging

## 2021-10-16 DIAGNOSIS — M9905 Segmental and somatic dysfunction of pelvic region: Secondary | ICD-10-CM | POA: Diagnosis not present

## 2021-10-16 DIAGNOSIS — M5441 Lumbago with sciatica, right side: Secondary | ICD-10-CM | POA: Diagnosis not present

## 2021-10-16 DIAGNOSIS — M9902 Segmental and somatic dysfunction of thoracic region: Secondary | ICD-10-CM | POA: Diagnosis not present

## 2021-10-16 DIAGNOSIS — M9903 Segmental and somatic dysfunction of lumbar region: Secondary | ICD-10-CM | POA: Diagnosis not present

## 2021-10-30 DIAGNOSIS — M9905 Segmental and somatic dysfunction of pelvic region: Secondary | ICD-10-CM | POA: Diagnosis not present

## 2021-10-30 DIAGNOSIS — M5441 Lumbago with sciatica, right side: Secondary | ICD-10-CM | POA: Diagnosis not present

## 2021-10-30 DIAGNOSIS — M9902 Segmental and somatic dysfunction of thoracic region: Secondary | ICD-10-CM | POA: Diagnosis not present

## 2021-10-30 DIAGNOSIS — M9903 Segmental and somatic dysfunction of lumbar region: Secondary | ICD-10-CM | POA: Diagnosis not present

## 2021-10-31 ENCOUNTER — Encounter: Payer: Self-pay | Admitting: *Deleted

## 2021-10-31 ENCOUNTER — Ambulatory Visit: Payer: Medicare PPO | Admitting: Diagnostic Neuroimaging

## 2021-11-01 DIAGNOSIS — M549 Dorsalgia, unspecified: Secondary | ICD-10-CM | POA: Diagnosis not present

## 2021-11-01 DIAGNOSIS — R030 Elevated blood-pressure reading, without diagnosis of hypertension: Secondary | ICD-10-CM | POA: Diagnosis not present

## 2021-11-01 DIAGNOSIS — Z6823 Body mass index (BMI) 23.0-23.9, adult: Secondary | ICD-10-CM | POA: Diagnosis not present

## 2021-11-01 DIAGNOSIS — R109 Unspecified abdominal pain: Secondary | ICD-10-CM | POA: Diagnosis not present

## 2021-11-02 DIAGNOSIS — N261 Atrophy of kidney (terminal): Secondary | ICD-10-CM | POA: Diagnosis not present

## 2021-11-02 DIAGNOSIS — Z6823 Body mass index (BMI) 23.0-23.9, adult: Secondary | ICD-10-CM | POA: Diagnosis not present

## 2021-11-02 DIAGNOSIS — I700 Atherosclerosis of aorta: Secondary | ICD-10-CM | POA: Diagnosis not present

## 2021-11-02 DIAGNOSIS — R109 Unspecified abdominal pain: Secondary | ICD-10-CM | POA: Diagnosis not present

## 2021-11-02 DIAGNOSIS — K5730 Diverticulosis of large intestine without perforation or abscess without bleeding: Secondary | ICD-10-CM | POA: Diagnosis not present

## 2021-11-02 DIAGNOSIS — Z9049 Acquired absence of other specified parts of digestive tract: Secondary | ICD-10-CM | POA: Diagnosis not present

## 2021-11-02 DIAGNOSIS — K449 Diaphragmatic hernia without obstruction or gangrene: Secondary | ICD-10-CM | POA: Diagnosis not present

## 2021-11-02 DIAGNOSIS — M549 Dorsalgia, unspecified: Secondary | ICD-10-CM | POA: Diagnosis not present

## 2021-11-08 DIAGNOSIS — M5416 Radiculopathy, lumbar region: Secondary | ICD-10-CM | POA: Diagnosis not present

## 2021-11-08 DIAGNOSIS — G629 Polyneuropathy, unspecified: Secondary | ICD-10-CM | POA: Diagnosis not present

## 2021-11-08 DIAGNOSIS — N183 Chronic kidney disease, stage 3 unspecified: Secondary | ICD-10-CM | POA: Diagnosis not present

## 2021-11-08 DIAGNOSIS — E7849 Other hyperlipidemia: Secondary | ICD-10-CM | POA: Diagnosis not present

## 2021-11-08 DIAGNOSIS — R413 Other amnesia: Secondary | ICD-10-CM | POA: Diagnosis not present

## 2021-11-08 DIAGNOSIS — M545 Low back pain, unspecified: Secondary | ICD-10-CM | POA: Diagnosis not present

## 2021-11-08 DIAGNOSIS — Z6824 Body mass index (BMI) 24.0-24.9, adult: Secondary | ICD-10-CM | POA: Diagnosis not present

## 2021-11-08 DIAGNOSIS — I10 Essential (primary) hypertension: Secondary | ICD-10-CM | POA: Diagnosis not present

## 2021-11-09 DIAGNOSIS — R739 Hyperglycemia, unspecified: Secondary | ICD-10-CM | POA: Diagnosis not present

## 2021-11-09 DIAGNOSIS — N189 Chronic kidney disease, unspecified: Secondary | ICD-10-CM | POA: Diagnosis not present

## 2021-11-09 DIAGNOSIS — E875 Hyperkalemia: Secondary | ICD-10-CM | POA: Diagnosis not present

## 2021-11-09 DIAGNOSIS — M545 Low back pain, unspecified: Secondary | ICD-10-CM | POA: Diagnosis not present

## 2021-11-09 DIAGNOSIS — M797 Fibromyalgia: Secondary | ICD-10-CM | POA: Diagnosis not present

## 2021-11-09 DIAGNOSIS — E876 Hypokalemia: Secondary | ICD-10-CM | POA: Diagnosis not present

## 2021-11-09 DIAGNOSIS — N3000 Acute cystitis without hematuria: Secondary | ICD-10-CM | POA: Diagnosis not present

## 2021-11-09 DIAGNOSIS — N183 Chronic kidney disease, stage 3 unspecified: Secondary | ICD-10-CM | POA: Diagnosis not present

## 2021-11-09 DIAGNOSIS — G4730 Sleep apnea, unspecified: Secondary | ICD-10-CM | POA: Diagnosis not present

## 2021-11-09 DIAGNOSIS — M5136 Other intervertebral disc degeneration, lumbar region: Secondary | ICD-10-CM | POA: Diagnosis not present

## 2021-11-09 DIAGNOSIS — F32A Depression, unspecified: Secondary | ICD-10-CM | POA: Diagnosis not present

## 2021-11-09 DIAGNOSIS — E785 Hyperlipidemia, unspecified: Secondary | ICD-10-CM | POA: Diagnosis not present

## 2021-11-09 DIAGNOSIS — I129 Hypertensive chronic kidney disease with stage 1 through stage 4 chronic kidney disease, or unspecified chronic kidney disease: Secondary | ICD-10-CM | POA: Diagnosis not present

## 2021-11-13 DIAGNOSIS — M519 Unspecified thoracic, thoracolumbar and lumbosacral intervertebral disc disorder: Secondary | ICD-10-CM | POA: Diagnosis not present

## 2021-11-13 DIAGNOSIS — E7849 Other hyperlipidemia: Secondary | ICD-10-CM | POA: Diagnosis not present

## 2021-11-13 DIAGNOSIS — F329 Major depressive disorder, single episode, unspecified: Secondary | ICD-10-CM | POA: Diagnosis not present

## 2021-11-13 DIAGNOSIS — N183 Chronic kidney disease, stage 3 unspecified: Secondary | ICD-10-CM | POA: Diagnosis not present

## 2021-11-13 DIAGNOSIS — I10 Essential (primary) hypertension: Secondary | ICD-10-CM | POA: Diagnosis not present

## 2021-11-13 DIAGNOSIS — G629 Polyneuropathy, unspecified: Secondary | ICD-10-CM | POA: Diagnosis not present

## 2021-11-13 DIAGNOSIS — R296 Repeated falls: Secondary | ICD-10-CM | POA: Diagnosis not present

## 2021-11-13 DIAGNOSIS — R413 Other amnesia: Secondary | ICD-10-CM | POA: Diagnosis not present

## 2021-11-13 DIAGNOSIS — M5416 Radiculopathy, lumbar region: Secondary | ICD-10-CM | POA: Diagnosis not present

## 2021-11-15 DIAGNOSIS — M5416 Radiculopathy, lumbar region: Secondary | ICD-10-CM | POA: Diagnosis not present

## 2021-11-15 DIAGNOSIS — M545 Low back pain, unspecified: Secondary | ICD-10-CM | POA: Diagnosis not present

## 2021-11-21 DIAGNOSIS — I10 Essential (primary) hypertension: Secondary | ICD-10-CM | POA: Diagnosis not present

## 2021-11-21 DIAGNOSIS — M545 Low back pain, unspecified: Secondary | ICD-10-CM | POA: Diagnosis not present

## 2021-11-21 DIAGNOSIS — M5416 Radiculopathy, lumbar region: Secondary | ICD-10-CM | POA: Diagnosis not present

## 2021-11-21 DIAGNOSIS — K219 Gastro-esophageal reflux disease without esophagitis: Secondary | ICD-10-CM | POA: Diagnosis not present

## 2021-11-21 DIAGNOSIS — N183 Chronic kidney disease, stage 3 unspecified: Secondary | ICD-10-CM | POA: Diagnosis not present

## 2021-11-24 DIAGNOSIS — M545 Low back pain, unspecified: Secondary | ICD-10-CM | POA: Diagnosis not present

## 2021-11-24 DIAGNOSIS — M5416 Radiculopathy, lumbar region: Secondary | ICD-10-CM | POA: Diagnosis not present

## 2021-12-06 DIAGNOSIS — M4120 Other idiopathic scoliosis, site unspecified: Secondary | ICD-10-CM | POA: Diagnosis not present

## 2021-12-06 DIAGNOSIS — M5412 Radiculopathy, cervical region: Secondary | ICD-10-CM | POA: Diagnosis not present

## 2021-12-06 DIAGNOSIS — M5416 Radiculopathy, lumbar region: Secondary | ICD-10-CM | POA: Diagnosis not present

## 2021-12-07 ENCOUNTER — Other Ambulatory Visit (HOSPITAL_COMMUNITY): Payer: Self-pay | Admitting: Neurosurgery

## 2021-12-07 DIAGNOSIS — M5412 Radiculopathy, cervical region: Secondary | ICD-10-CM

## 2021-12-15 DIAGNOSIS — E876 Hypokalemia: Secondary | ICD-10-CM | POA: Diagnosis not present

## 2021-12-15 DIAGNOSIS — M519 Unspecified thoracic, thoracolumbar and lumbosacral intervertebral disc disorder: Secondary | ICD-10-CM | POA: Diagnosis not present

## 2021-12-15 DIAGNOSIS — R6889 Other general symptoms and signs: Secondary | ICD-10-CM | POA: Diagnosis not present

## 2021-12-15 DIAGNOSIS — R32 Unspecified urinary incontinence: Secondary | ICD-10-CM | POA: Diagnosis not present

## 2021-12-15 DIAGNOSIS — R413 Other amnesia: Secondary | ICD-10-CM | POA: Diagnosis not present

## 2021-12-15 DIAGNOSIS — I10 Essential (primary) hypertension: Secondary | ICD-10-CM | POA: Diagnosis not present

## 2021-12-15 DIAGNOSIS — M5416 Radiculopathy, lumbar region: Secondary | ICD-10-CM | POA: Diagnosis not present

## 2021-12-15 DIAGNOSIS — N183 Chronic kidney disease, stage 3 unspecified: Secondary | ICD-10-CM | POA: Diagnosis not present

## 2021-12-15 DIAGNOSIS — R296 Repeated falls: Secondary | ICD-10-CM | POA: Diagnosis not present

## 2021-12-22 DIAGNOSIS — R0602 Shortness of breath: Secondary | ICD-10-CM | POA: Diagnosis not present

## 2021-12-22 DIAGNOSIS — I081 Rheumatic disorders of both mitral and tricuspid valves: Secondary | ICD-10-CM | POA: Diagnosis not present

## 2021-12-25 DIAGNOSIS — K219 Gastro-esophageal reflux disease without esophagitis: Secondary | ICD-10-CM | POA: Diagnosis not present

## 2021-12-25 DIAGNOSIS — E875 Hyperkalemia: Secondary | ICD-10-CM | POA: Diagnosis not present

## 2021-12-25 DIAGNOSIS — R739 Hyperglycemia, unspecified: Secondary | ICD-10-CM | POA: Diagnosis not present

## 2022-01-01 DIAGNOSIS — N950 Postmenopausal bleeding: Secondary | ICD-10-CM | POA: Diagnosis not present

## 2022-01-04 ENCOUNTER — Ambulatory Visit (HOSPITAL_COMMUNITY)
Admission: RE | Admit: 2022-01-04 | Discharge: 2022-01-04 | Disposition: A | Discharge status: Home / Self Care | Payer: Medicare PPO | Source: Ambulatory Visit | Attending: Neurosurgery | Admitting: Neurosurgery

## 2022-01-04 DIAGNOSIS — M542 Cervicalgia: Secondary | ICD-10-CM | POA: Diagnosis not present

## 2022-01-04 DIAGNOSIS — M5412 Radiculopathy, cervical region: Secondary | ICD-10-CM | POA: Insufficient documentation

## 2022-01-04 DIAGNOSIS — M4312 Spondylolisthesis, cervical region: Secondary | ICD-10-CM | POA: Diagnosis not present

## 2022-01-09 DIAGNOSIS — N888 Other specified noninflammatory disorders of cervix uteri: Secondary | ICD-10-CM | POA: Diagnosis not present

## 2022-01-09 DIAGNOSIS — N950 Postmenopausal bleeding: Secondary | ICD-10-CM | POA: Diagnosis not present

## 2022-01-10 DIAGNOSIS — M519 Unspecified thoracic, thoracolumbar and lumbosacral intervertebral disc disorder: Secondary | ICD-10-CM | POA: Diagnosis not present

## 2022-01-10 DIAGNOSIS — R32 Unspecified urinary incontinence: Secondary | ICD-10-CM | POA: Diagnosis not present

## 2022-01-10 DIAGNOSIS — R6889 Other general symptoms and signs: Secondary | ICD-10-CM | POA: Diagnosis not present

## 2022-01-10 DIAGNOSIS — R413 Other amnesia: Secondary | ICD-10-CM | POA: Diagnosis not present

## 2022-01-10 DIAGNOSIS — D649 Anemia, unspecified: Secondary | ICD-10-CM | POA: Diagnosis not present

## 2022-01-10 DIAGNOSIS — N939 Abnormal uterine and vaginal bleeding, unspecified: Secondary | ICD-10-CM | POA: Diagnosis not present

## 2022-01-10 DIAGNOSIS — R296 Repeated falls: Secondary | ICD-10-CM | POA: Diagnosis not present

## 2022-01-10 DIAGNOSIS — E876 Hypokalemia: Secondary | ICD-10-CM | POA: Diagnosis not present

## 2022-01-10 DIAGNOSIS — M5416 Radiculopathy, lumbar region: Secondary | ICD-10-CM | POA: Diagnosis not present

## 2022-01-18 ENCOUNTER — Encounter: Payer: Self-pay | Admitting: *Deleted

## 2022-01-22 ENCOUNTER — Encounter: Payer: Self-pay | Admitting: Neurology

## 2022-01-22 ENCOUNTER — Ambulatory Visit: Payer: Medicare PPO | Admitting: Neurology

## 2022-01-22 VITALS — BP 149/76 | HR 60 | Ht 62.0 in | Wt 122.8 lb

## 2022-01-22 DIAGNOSIS — R296 Repeated falls: Secondary | ICD-10-CM | POA: Diagnosis not present

## 2022-01-22 DIAGNOSIS — N289 Disorder of kidney and ureter, unspecified: Secondary | ICD-10-CM

## 2022-01-22 DIAGNOSIS — R748 Abnormal levels of other serum enzymes: Secondary | ICD-10-CM | POA: Diagnosis not present

## 2022-01-22 DIAGNOSIS — R413 Other amnesia: Secondary | ICD-10-CM

## 2022-01-22 NOTE — Progress Notes (Signed)
Subjective:    Patient ID: Jody Russell is a 71 y.o. female.  HPI    Star Age, MD, PhD [facility] [address] [place], [place] [address]  Dear Dr. Pleas Koch,   I saw your patient, Jody Russell, upon your kind request in my neurologic clinic today for initial consultation of her memory loss.  The patient is accompanied her husband today.  As you know, Jody Russell is a 71 year old female with an underlying medical history of anemia, aortic atherosclerosis, chronic kidney disease, degenerative lumbar and cervical disc disease, diverticulosis, reflux disease, hypertension, hyperlipidemia, irritable bowel syndrome, anxiety, depression, lower extremity edema, and arthritis, who reports a several month history of forgetfulness, including forgetting conversations, misplacing things, forgetting where she parked her car, some confusion at times.  Her mom had Alzheimer's dementia..  I reviewed your office note from 12/15/2021.  She has seen neurosurgery for neck and back pain.  She had blood work through your office on 12/15/2021 and I reviewed the results: CBC with differential showed elevated WBC at 13.3, hemoglobin below normal at 10.9, hematocrit below normal at 31.5, platelets elevated at 614, neutrophils elevated at 9.6.  CMP showed elevated BUN at 45, creatinine elevated at 2.17, alkaline phosphatase elevated at 368, AST elevated at 52, ALT elevated at 113.  Vitamin D was 54.6. TSH was 2.61.  She had repeat blood work on 12/25/2021 which showed an elevated creatinine at 2.4, BUN had improved at 13, alk phos improved at 169, AST and ALT also improved.  She does not drink any alcohol.  She tries to hydrate well with water, she does not smoke.  She has fallen and feels that her balance has been off, her husband feels that her falls come about suddenly, like her legs give out.  She does report sciatica, she is supposed to see Dr. Trenton Gammon again tomorrow.  She is  supposed to see a nephrologist but an appointment has not been set up yet.  She has had dysfunctional uterine bleeding and has a follow-up with her GYN. She had a brain MRI without contrast through [facility] on 08/08/2021 and I reviewed the results: Impression: No specific or reversible causes for memory loss.  Mild chronic small vessel ischemia in the hemispheric white matter.  She had a recent cervical spine MRI without contrast on 01/04/2022 and I reviewed the results: IMPRESSION: 1. At C4-5 there is a mild broad-based disc osteophyte complex. Bilateral uncovertebral degenerative changes. Moderate left foraminal stenosis. Mild right foraminal stenosis. 2. At C5-6 there is a broad-based disc osteophyte complex. Bilateral uncovertebral degenerative changes. Moderate-severe right foraminal stenosis. Mild left foraminal stenosis. Mild spinal stenosis. 3. At C6-7 there is a broad-based disc osteophyte complex. Left uncovertebral degenerative changes. Moderate left foraminal stenosis. 4. Severe degenerative disease with disc height loss at C5-6 with severe marrow edema throughout the C5 and C6 vertebral bodies. She had a lumbar spine with and without contrast through [facility] on 11/09/2021 and I reviewed the results, impression:1. No significant changes are seen in the lumbar spine compared with  the previous MRI from 2021.  2. Suspected new small disc extrusion within the right foramen at  T11-12 which could affect the right T11 nerve root.  3. Chronic disc degeneration with asymmetric disc bulging and  endplate osteophytes on the left at L1-2 and L2-3, with resulting  mild predominately left-sided foraminal narrowing.  4. Stable asymmetric narrowing of the right lateral recess and right  foramen at  L3-4.  5. Stable chronic right foraminal and lateral recess narrowing at  L4-5.  6. Chronic L5 pars defects with chronic right-greater-than-left  foraminal narrowing at L5-S1 which could affect  either exiting L5  nerve root.  She is a retired Pharmacist, hospital.  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  She is currently not driving.  Her Past Medical History Is Significant For: Past Medical History:  Diagnosis Date   Aortic atherosclerosis (HCC)    CKD (chronic kidney disease)    DDD (degenerative disc disease), cervical    DDD (degenerative disc disease), lumbar    Depression    Diverticulosis    Dizziness    GERD (gastroesophageal reflux disease)    HTN (hypertension)    Hyperlipidemia    IBS (irritable bowel syndrome)    Memory change    Paresthesia of both hands    Sciatica     Her Past Surgical History Is Significant For: Past Surgical History:  Procedure Laterality Date   APPENDECTOMY  2011   COLONOSCOPY  12/12/2012   DILATION AND CURETTAGE OF UTERUS     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    Her Family History Is Significant For: Family History  Problem Relation Age of Onset   Alzheimer's disease Mother    Kidney failure Father    Cervical cancer Sister    Diabetes Brother    Kidney disease Brother    Ovarian cancer Other    Colon cancer Other     Her Social History Is Significant For: Social History   Socioeconomic History   Marital status: Married    Spouse name: Jody Russell   Number of children: 2   Years of education: Not on file   Highest education level: Not on file  Occupational History    Comment: retired  Tobacco Use   Smoking status: Never   Smokeless tobacco: Never  Substance and Sexual Activity   Alcohol use: Never   Drug use: Never   Sexual activity: Not on file  Other Topics Concern   Not on file  Social History Narrative   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   Social Determinants of Health   Financial Resource Strain: Not on file  Food Insecurity: Not on file  Transportation Needs: Not on file  Physical Activity: Not on file  Stress: Not on file  Social Connections:  Not on file    Her Allergies Are:  Allergies  Allergen Reactions   Lisinopril     Other reaction(s): Other (See Comments) Elevated potassium level   Statins     Other reaction(s): Muscle Pain Joint and muscle aches  :   Her Current Medications Are:  Outpatient Encounter Medications as of 01/22/2022  Medication Sig   Calcium Citrate-Vitamin D (CALCIUM + D PO) Take by mouth.   chlorthalidone (HYGROTON) 25 MG tablet Take 25 mg by mouth daily.   FLUoxetine (PROZAC) 40 MG capsule Take 40 mg by mouth daily.   metoprolol succinate (TOPROL-XL) 25 MG 24 hr tablet Take 25 mg by mouth daily.   omeprazole (PRILOSEC) 20 MG capsule Take 20 mg by mouth 2 (two) times daily.   rosuvastatin (CRESTOR) 10 MG tablet Take 10 mg by mouth 3 (three) times a week.   estrogen, conjugated,-medroxyprogesterone (PREMPRO) 0.45-1.5 MG tablet Take 1 tablet by mouth daily. (Patient not taking: Reported on 01/22/2022)   No facility-administered encounter medications on file as of 01/22/2022.  :  Review of Systems:  Out of a complete 14 point review of systems, all are reviewed and negative with the exception of these symptoms as listed below:  Review of Systems  Neurological:        Pt in room 9. Pt is with her husband. Pt is here for memory loss, problems with muscle control, and falling. Pt is having trouble finding her car. Pt states she had fallen recently since August but not within the last week.     Objective:  Neurological Exam  Physical Exam Physical Examination:   Vitals:   01/22/22 1529  BP: (!) 149/76  Pulse: 60   General Examination: The patient is a very pleasant 71 y.o. female in no acute distress. She appears well-developed and well-nourished and well groomed.   HEENT: Normocephalic, atraumatic, pupils are equal, round and reactive to light, she has very mild difficulty with extraocular tracking, no nystagmus, perhaps mild decrease in eye blink rate and minimal facial masking, no  significant nuchal rigidity.  No lip, neck or jaw tremor, no carotid bruits.  Airway examination reveals mild mouth dryness, tongue protrudes centrally and palate elevates symmetrically.  Normal sensation to light touch in the face.  Hearing grossly intact.    Chest: Clear to auscultation without wheezing, rhonchi or crackles noted.  Heart: S1+S2+0, regular and normal without murmurs, rubs or gallops noted.   Abdomen: Soft, non-tender and non-distended.  Extremities: There is puffiness in both lower extremities, right more than left.   Skin: Warm and dry without trophic changes noted.   Musculoskeletal: exam reveals no obvious joint deformities.   Neurologically:  Mental status: The patient is awake, alert, history is supplemented by her husband.  Mood is normal, affect is normal.    01/22/2022    3:40 PM  MMSE - Mini Mental State Exam  Orientation to time 5  Orientation to Place 4  Registration 3  Attention/ Calculation 3  Recall 3  Language- name 2 objects 2  Language- repeat 1  Language- follow 3 step command 3  Language- read & follow direction 1  Write a sentence 1  Copy design 1  Total score 27   On 01/22/2022: CDT: 4/4, AFT: 8/min.  Cranial nerves II - XII are as described above under HEENT exam.  Motor exam: Normal bulk, strength and tone is noted. There is no obvious action or resting tremor.  Fine motor skills and coordination: grossly intact.  Cerebellar testing: No dysmetria or intention tremor. There is no truncal or gait ataxia.  Sensory exam: intact to light touch in the upper and lower extremities.  Gait, station and balance: She stands without difficulty, posture is age-appropriate, she walks without a walking cane, mildly in securely, preserved arm swing, no shuffling.  Assessment and Plan:  In summary, Jody Russell is a very pleasant 71 y.o.-year old female with an underlying medical history of anemia, aortic atherosclerosis, chronic kidney disease,  degenerative lumbar and cervical disc disease, diverticulosis, reflux disease, hypertension, hyperlipidemia, irritable bowel syndrome, anxiety, depression, lower extremity edema, and arthritis, who presents for evaluation of her memory loss of several months duration.  She has had recent significant abnormalities on blood work in October 7510 with certain improvements noted in her liver function, no significant improvement in her creatinine level although BUN had improved, she has had evidence of anemia, dysfunctional uterine bleeding, was treated for UTI recently as well.  She is scheduled for blood work through your office in December.  Memory evaluation today shows  mildly abnormal numbers, family history of Alzheimer's does put her at risk for dementia, she also has some vascular risk factors, has had recent abnormalities with elevated white cell count, abnormal liver function, worsening of her kidney impairment.  I think the focus should be on evaluation and management of her medical issues.  She is supposed to see a nephrologist, she is advised to follow-up with her GYN, her neurosurgeon and with your office for now.  We will continue to monitor her memory function, no telltale signs of Parkinson's disease were seen, she does have very subtle findings in her examination but not enough to justify a diagnosis of atypical parkinsonism.  We will continue to monitor her memory function, fall risk may tighten up with her degenerative spine disease.  She is advised that with her next blood draw in your office I recommend evaluation for vitamin B12 deficiency, A1c, hepatitis profile as well.  She has not yet heard from the nephrology office.  We talked about the importance of maintaining a healthy lifestyle, good nutrition, good hydration with water, fall prevention.  Your recent brain MRI from June 2023 did not show any focal abnormalities, no significant volume loss, no significant degree of vascular changes.  I  would like to see her back for recheck in 3 months.  In the interim, she is advised to keep all her appointments with her other specialists and with your office. Thank you very much for allowing me to participate in the care of this nice patient. If I can be of any further assistance to you please do not hesitate to call me at [phone].  Sincerely,   Star Age, MD, PhD  This was an extended visit of over 1 hour with extended chart review involved.

## 2022-01-22 NOTE — Patient Instructions (Signed)
You have complaints of memory loss: memory loss or changes in cognitive function can have many reasons and does not always mean you have dementia. Conditions that can contribute to subjective or objective memory loss include: depression, stress, poor sleep from insomnia or sleep apnea, dehydration, fluctuation in blood sugar values, thyroid or electrolyte dysfunction and certain vitamin deficiencies. Dementia can be caused by stroke, brain atherosclerosis or brain vascular disease due to vascular risk factors (smoking, high blood pressure, high cholesterol, obesity and uncontrolled diabetes), certain degenerative brain disorders (including Parkinson's disease and Multiple sclerosis) and by Alzheimer's disease or other, more rare and sometimes hereditary causes.   I would like to suggest additional testing: blood work (which has been done recently already) but I would like for your primary care to check vitamin B12, A1C, hepatitis.  I recommend follow up for anemia and a referral to a kidney specialist.   I will request your brain scan report from Dr. Cato Mulligan office.   We will not start medication as yet.   We will monitor your symptoms.   Your memory loss is rather mild at this point, which, of course is reassuring.

## 2022-01-23 DIAGNOSIS — M5412 Radiculopathy, cervical region: Secondary | ICD-10-CM | POA: Diagnosis not present

## 2022-01-23 DIAGNOSIS — M4120 Other idiopathic scoliosis, site unspecified: Secondary | ICD-10-CM | POA: Diagnosis not present

## 2022-01-24 DIAGNOSIS — N179 Acute kidney failure, unspecified: Secondary | ICD-10-CM | POA: Diagnosis not present

## 2022-01-24 DIAGNOSIS — D631 Anemia in chronic kidney disease: Secondary | ICD-10-CM | POA: Diagnosis not present

## 2022-01-24 DIAGNOSIS — N2581 Secondary hyperparathyroidism of renal origin: Secondary | ICD-10-CM | POA: Diagnosis not present

## 2022-01-24 DIAGNOSIS — N184 Chronic kidney disease, stage 4 (severe): Secondary | ICD-10-CM | POA: Diagnosis not present

## 2022-01-24 DIAGNOSIS — I129 Hypertensive chronic kidney disease with stage 1 through stage 4 chronic kidney disease, or unspecified chronic kidney disease: Secondary | ICD-10-CM | POA: Diagnosis not present

## 2022-01-24 DIAGNOSIS — N189 Chronic kidney disease, unspecified: Secondary | ICD-10-CM | POA: Diagnosis not present

## 2022-01-29 ENCOUNTER — Other Ambulatory Visit: Payer: Self-pay | Admitting: Nephrology

## 2022-01-29 DIAGNOSIS — I129 Hypertensive chronic kidney disease with stage 1 through stage 4 chronic kidney disease, or unspecified chronic kidney disease: Secondary | ICD-10-CM

## 2022-01-29 DIAGNOSIS — N179 Acute kidney failure, unspecified: Secondary | ICD-10-CM

## 2022-02-23 ENCOUNTER — Ambulatory Visit
Admission: RE | Admit: 2022-02-23 | Discharge: 2022-02-23 | Disposition: A | Discharge status: Home / Self Care | Payer: Medicare PPO | Source: Ambulatory Visit | Attending: Nephrology | Admitting: Nephrology

## 2022-02-23 DIAGNOSIS — I129 Hypertensive chronic kidney disease with stage 1 through stage 4 chronic kidney disease, or unspecified chronic kidney disease: Secondary | ICD-10-CM

## 2022-02-23 DIAGNOSIS — N179 Acute kidney failure, unspecified: Secondary | ICD-10-CM

## 2022-02-23 DIAGNOSIS — N261 Atrophy of kidney (terminal): Secondary | ICD-10-CM | POA: Diagnosis not present

## 2022-02-23 DIAGNOSIS — I10 Essential (primary) hypertension: Secondary | ICD-10-CM | POA: Diagnosis not present

## 2022-04-03 DIAGNOSIS — E876 Hypokalemia: Secondary | ICD-10-CM | POA: Diagnosis not present

## 2022-04-03 DIAGNOSIS — E7849 Other hyperlipidemia: Secondary | ICD-10-CM | POA: Diagnosis not present

## 2022-04-03 DIAGNOSIS — K219 Gastro-esophageal reflux disease without esophagitis: Secondary | ICD-10-CM | POA: Diagnosis not present

## 2022-04-03 DIAGNOSIS — N183 Chronic kidney disease, stage 3 unspecified: Secondary | ICD-10-CM | POA: Diagnosis not present

## 2022-04-10 DIAGNOSIS — N2581 Secondary hyperparathyroidism of renal origin: Secondary | ICD-10-CM | POA: Diagnosis not present

## 2022-04-10 DIAGNOSIS — D631 Anemia in chronic kidney disease: Secondary | ICD-10-CM | POA: Diagnosis not present

## 2022-04-10 DIAGNOSIS — I129 Hypertensive chronic kidney disease with stage 1 through stage 4 chronic kidney disease, or unspecified chronic kidney disease: Secondary | ICD-10-CM | POA: Diagnosis not present

## 2022-04-10 DIAGNOSIS — N1832 Chronic kidney disease, stage 3b: Secondary | ICD-10-CM | POA: Diagnosis not present

## 2022-04-12 DIAGNOSIS — D649 Anemia, unspecified: Secondary | ICD-10-CM | POA: Diagnosis not present

## 2022-04-12 DIAGNOSIS — R413 Other amnesia: Secondary | ICD-10-CM | POA: Diagnosis not present

## 2022-04-12 DIAGNOSIS — M5030 Other cervical disc degeneration, unspecified cervical region: Secondary | ICD-10-CM | POA: Diagnosis not present

## 2022-04-12 DIAGNOSIS — M4317 Spondylolisthesis, lumbosacral region: Secondary | ICD-10-CM | POA: Diagnosis not present

## 2022-04-12 DIAGNOSIS — N2581 Secondary hyperparathyroidism of renal origin: Secondary | ICD-10-CM | POA: Diagnosis not present

## 2022-04-12 DIAGNOSIS — M5416 Radiculopathy, lumbar region: Secondary | ICD-10-CM | POA: Diagnosis not present

## 2022-04-12 DIAGNOSIS — N184 Chronic kidney disease, stage 4 (severe): Secondary | ICD-10-CM | POA: Diagnosis not present

## 2022-04-12 DIAGNOSIS — E876 Hypokalemia: Secondary | ICD-10-CM | POA: Diagnosis not present

## 2022-04-12 DIAGNOSIS — I10 Essential (primary) hypertension: Secondary | ICD-10-CM | POA: Diagnosis not present

## 2022-04-23 ENCOUNTER — Ambulatory Visit: Payer: Medicare PPO | Admitting: Neurology

## 2022-05-09 ENCOUNTER — Ambulatory Visit: Payer: Medicare PPO | Admitting: Neurology

## 2022-05-09 ENCOUNTER — Encounter: Payer: Self-pay | Admitting: Neurology

## 2022-05-09 VITALS — BP 158/74 | HR 58 | Ht 61.5 in | Wt 129.4 lb

## 2022-05-09 DIAGNOSIS — R413 Other amnesia: Secondary | ICD-10-CM

## 2022-05-09 DIAGNOSIS — F439 Reaction to severe stress, unspecified: Secondary | ICD-10-CM

## 2022-05-09 DIAGNOSIS — G3184 Mild cognitive impairment, so stated: Secondary | ICD-10-CM

## 2022-05-09 DIAGNOSIS — Z818 Family history of other mental and behavioral disorders: Secondary | ICD-10-CM | POA: Diagnosis not present

## 2022-05-09 MED ORDER — MEMANTINE HCL 5 MG PO TABS: 5.0000 mg | ORAL_TABLET | Freq: Every day | ORAL | 5 refills | Status: DC

## 2022-05-09 NOTE — Patient Instructions (Signed)
It was nice to see you both again today.  Please continue to work towards good hydration, good nutrition, maintaining your weight, exercising regularly and stress reduction.  As discussed, I would like for you to start a medication for memory loss called Namenda (generic name: Memantine), starting at 5 mg once daily at bedtime.  We will gradually increase to 1 pill twice daily with gradual buildup to 10 mg twice daily.  For now, take 1 pill at bedtime, we may increase at the next appointment.   Please note that side effects may include, but are not limited to: nausea, confusion, hallucination, personality changes. If you are having mild side effects, try to stick with the treatment as these initial side effects may go away after the first 10-14 days.     Please follow-up to see one of our nurse practitioners in 4 to 6 months.

## 2022-05-09 NOTE — Progress Notes (Signed)
Subjective:    Patient ID: Jody Russell is a 72 y.o. female.  HPI    Interim history:    [name] is a 72 year old female with an underlying medical history of anemia, aortic atherosclerosis, chronic kidney disease, degenerative lumbar and cervical disc disease, diverticulosis, reflux disease, hypertension, hyperlipidemia, irritable bowel syndrome, anxiety, depression, lower extremity edema, and arthritis, who presents for follow-up consultation of her memory loss.  The patient is accompanied by her husband today.  I first met her at the request of her primary care physician on 01/22/2022, at which time she reported a several month history of forgetfulness and confusion at times.  She reported a family history of Alzheimer's dementia affecting her mother.  She had an MMSE of 27 at the time.  She had a brain MRI in June 2023 which had shown chronic small vessel ischemia, in the mild range.  She was advised to seek evaluation for her impaired kidney function, she was supposed to see a nephrologist, she was supposed to have a follow-up for her neck pain with her neurosurgeon and she was advised to get additional blood work done with her primary care physician at the next checkup.  Today, 05/09/2022: She reports doing about the same.  She had a checkup with nephrology and had additional testing.  She was taken off of chlorthalidone.  She was started on amlodipine as I understand.  She saw her GYN and was taken off of Prempro which was thought to cause dysfunctional uterine bleeding.  Her bleeding stopped.  She has had intermittent confusion, continues to have word finding difficulty.  She has started going back to son's school and helping.  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  Recently, she went to a family grave and got lost even though this is not her first time going, her husband had given her instructions as to  get there and she got frustrated and came back.  She and her husband have been dealing with the estate of her brother who died in 03/10/2022.  He was single and lived in their [place] and had a lot of stuff including records and other items that he had collected over time.  They are sifting through this and also her other siblings and family members are helping cleaning up the home he resided in.  We talked about her stressors at length today.  She does report better ever diet and has gained about 10 pounds, she had lost about 15 pounds in the recent past.  The patient's allergies, current medications, family history, past medical history, past social history, past surgical history and problem list were reviewed and updated as appropriate.   Previously:   01/22/22: (She) reports a several month history of forgetfulness, including forgetting conversations, misplacing things, forgetting where she parked her car, some confusion at times.  Her mom had Alzheimer's dementia..  I reviewed your office note from 12/15/2021.  She has seen neurosurgery for neck and back pain.  She had blood work through your office on 12/15/2021 and I reviewed the results: CBC with differential showed elevated WBC at 13.3, hemoglobin below normal at 10.9, hematocrit below normal at 31.5, platelets elevated at 614, neutrophils elevated at 9.6.  CMP showed elevated BUN at 45, creatinine elevated at 2.17, alkaline phosphatase elevated at 368, AST elevated at 52, ALT elevated at 113.  Vitamin D was 54.6.  TSH was 2.61.  She had repeat blood work on 12/25/2021 which showed an elevated creatinine at 2.4, BUN had improved at 13, alk phos improved at 169, AST and ALT also improved.  She does not drink any alcohol.  She tries to hydrate well with water, she does not smoke.  She has fallen and feels that her balance has been off, her husband feels that her falls come about suddenly, like her legs give out.  She does report sciatica, she is  supposed to see Dr. Trenton Gammon again tomorrow.  She is supposed to see a nephrologist but an appointment has not been set up yet.  She has had dysfunctional uterine bleeding and has a follow-up with her GYN.  She had a brain MRI without contrast through [facility] on 08/08/2021 and I reviewed the results: Impression: No specific or reversible causes for memory loss.  Mild chronic small vessel ischemia in the hemispheric white matter.   She had a recent cervical spine MRI without contrast on 01/04/2022 and I reviewed the results: IMPRESSION: 1. At C4-5 there is a mild broad-based disc osteophyte complex. Bilateral uncovertebral degenerative changes. Moderate left foraminal stenosis. Mild right foraminal stenosis. 2. At C5-6 there is a broad-based disc osteophyte complex. Bilateral uncovertebral degenerative changes. Moderate-severe right foraminal stenosis. Mild left foraminal stenosis. Mild spinal stenosis. 3. At C6-7 there is a broad-based disc osteophyte complex. Left uncovertebral degenerative changes. Moderate left foraminal stenosis. 4. Severe degenerative disease with disc height loss at C5-6 with severe marrow edema throughout the C5 and C6 vertebral bodies. She had a lumbar spine with and without contrast through  [facility] on 11/09/2021 and I reviewed the results, impression:1. No significant changes are seen in the lumbar spine compared with  the previous MRI from 2021.  2. Suspected new small disc extrusion within the right foramen at  T11-12 which could affect the right T11 nerve root.  3. Chronic disc degeneration with asymmetric disc bulging and  endplate osteophytes on the left at L1-2 and L2-3, with resulting  mild predominately left-sided foraminal narrowing.  4. Stable asymmetric narrowing of the right lateral recess and right  foramen at L3-4.  5. Stable chronic right foraminal and lateral recess narrowing at  L4-5.  6. Chronic L5 pars defects with chronic right-greater-than-left   foraminal narrowing at L5-S1 which could affect either exiting L5  nerve root.   She is a retired Pharmacist, hospital.  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  She is currently not driving.    Her Past Medical History Is Significant For: Past Medical History:  Diagnosis Date   Aortic atherosclerosis ([place])    CKD (chronic kidney disease)    DDD (degenerative disc disease), cervical    DDD (degenerative disc disease), lumbar    Depression    Diverticulosis    Dizziness    GERD (gastroesophageal reflux disease)    HTN (hypertension)    Hyperlipidemia    IBS (irritable bowel syndrome)    Memory change    Paresthesia of both hands    Sciatica     Her Past Surgical History Is Significant For: Past Surgical History:  Procedure Laterality Date   APPENDECTOMY  2011   COLONOSCOPY  12/12/2012   DILATION AND CURETTAGE OF UTERUS     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    Her Family History Is Significant For: Family History  Problem Relation Age of Onset  Alzheimer's disease Mother    Kidney failure Father    Cervical cancer Sister    Diabetes Brother    Kidney disease Brother    Ovarian cancer Other    Colon cancer Other     Her Social History Is Significant For: Social History   Socioeconomic History   Marital status: Married    Spouse name: Cornelia Copa   Number of children: 2   Years of education: Not on file   Highest education level: Not on file  Occupational History    Comment: retired  Tobacco Use   Smoking status: Never   Smokeless tobacco: Never  Substance and Sexual Activity   Alcohol use: Never   Drug use: Never   Sexual activity: Not on file  Other Topics Concern   Not on file  Social History Narrative   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   Social Determinants of Health   Financial Resource Strain: Not on file  Food Insecurity: Not on file  Transportation Needs: Not on file  Physical Activity:  Not on file  Stress: Not on file  Social Connections: Not on file    Her Allergies Are:  Allergies  Allergen Reactions   Lisinopril     Other reaction(s): Other (See Comments) Elevated potassium level   Statins     Other reaction(s): Muscle Pain Joint and muscle aches  :   Her Current Medications Are:  Outpatient Encounter Medications as of 05/09/2022  Medication Sig   amLODipine (NORVASC) 5 MG tablet Take 5 mg by mouth daily.   Calcium Citrate-Vitamin D (CALCIUM + D PO) Take by mouth.   FLUoxetine (PROZAC) 40 MG capsule Take 40 mg by mouth daily.   metoprolol succinate (TOPROL-XL) 25 MG 24 hr tablet Take 25 mg by mouth daily.   omeprazole (PRILOSEC) 20 MG capsule Take 20 mg by mouth 2 (two) times daily.   rosuvastatin (CRESTOR) 10 MG tablet Take 10 mg by mouth 3 (three) times a week.   [DISCONTINUED] chlorthalidone (HYGROTON) 25 MG tablet Take 25 mg by mouth daily.   [DISCONTINUED] estrogen, conjugated,-medroxyprogesterone (PREMPRO) 0.45-1.5 MG tablet Take 1 tablet by mouth daily. (Patient not taking: Reported on 01/22/2022)   No facility-administered encounter medications on file as of 05/09/2022.  :  Review of Systems:  Out of a complete 14 point review of systems, all are reviewed and negative with the exception of these symptoms as listed below:  Review of Systems  Neurological:        Follow up for memory.  MMSE 26/30.  Had labs done by Dr. Gean Quint.  [facility].      Objective:  Neurological Exam  Physical Exam Physical Examination:   Vitals:   05/09/22 1041 05/09/22 1054  BP: (!) 148/67 (!) 158/74  Pulse: (!) 55 (!) 58    General Examination: The patient is a very pleasant 72 y.o. female in no acute distress. She appears well-developed and well-nourished and well groomed.   HEENT: Normocephalic, atraumatic, pupils are equal, round and reactive to light, she has very mild difficulty with extraocular tracking, no nystagmus, perhaps mild  decrease in eye blink rate and minimal facial masking, no significant nuchal rigidity.  No lip, neck or jaw tremor, no carotid bruits.  Airway examination reveals mild mouth dryness, tongue protrudes centrally and palate elevates symmetrically.  Normal sensation to light touch in the face.  Hearing grossly intact.     Chest: Clear to auscultation without wheezing, rhonchi or crackles noted.  Heart: S1+S2+0, regular and normal without murmurs, rubs or gallops noted.    Abdomen: Soft, non-tender and non-distended.   Extremities: There is puffiness in both lower extremities, right more than left.    Skin: Warm and dry without trophic changes noted.    Musculoskeletal: exam reveals no obvious joint deformities.    Neurologically:  Mental status: The patient is awake, alert, history is supplemented by her husband.  Mood is normal, affect is normal.    05/09/2022   10:55 AM 01/22/2022    3:40 PM  MMSE - Mini Mental State Exam  Orientation to time 4 5  Orientation to Place 5 4  Registration 3 3  Attention/ Calculation 3 3  Recall 3 3  Language- name 2 objects 2 2  Language- repeat 1 1  Language- follow 3 step command 2 3  Language- read & follow direction 1 1  Write a sentence 1 1  Copy design 1 1  Total score 26 27   On 05/09/2022: CDT: 3/4, AFT: 10/min.  On 01/22/2022: CDT: 4/4, AFT: 8/min.   Cranial nerves II - XII are as described above under HEENT exam.  Motor exam: Normal bulk, strength and tone is noted. There is no obvious action or resting tremor.  Fine motor skills and coordination: grossly intact.  Cerebellar testing: No dysmetria or intention tremor. There is no truncal or gait ataxia.  Sensory exam: intact to light touch in the upper and lower extremities.  Gait, station and balance: She stands without difficulty, posture is age-appropriate, she walks without a walking aid, preserved arm swing, no shuffling.   Assessment and Plan:  In summary, Curtina Atlas is a very  pleasant 72 year old female with an underlying medical history of anemia, aortic atherosclerosis, chronic kidney disease, degenerative lumbar and cervical disc disease, diverticulosis, reflux disease, hypertension, hyperlipidemia, irritable bowel syndrome, anxiety, depression, lower extremity edema, and arthritis, who presents for follow-up consultation of her memory loss of several months duration, close to 1 year possibly.  She has had impaired kidney function.  She had a checkup with nephrology and also with GYN, she was taken off of chlorthalidone by nephrology and was taken off of Prempro by her GYN.  She has a family history of memory loss, her mom had dementia and her brother who passed away in 03-02-22 also had memory issues but had a lot of other chronic medical problems as well, as I understand.  Her memory scores have been more or less stable compared to November 2023.  Her brain MRI from June 2023 showed overall mild chronic small vessel disease, no telltale atrophy in keeping with Alzheimer's dementia.  We talked about her treatment options at length today.  Given that she has a mildly low heart rate in the mid to higher 50s, I would still like to avoid donepezil and similar medications at this point due to the possibility of bradycardia.  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  We talked about the importance of maintaining a healthy lifestyle, good nutrition, good hydration, getting enough rest, physical activity, stress reduction.  We talked about the expectations and possible common side effects of memantine, she will be asked to start 5 mg strength at bedtime for now.  At the next appointment we can consider gradually increasing it to 5 mg twice daily and then 10 mg twice daily eventually.  She is advised to follow-up in this office to see one of our  nurse practitioners in 4 to 6 months, sooner if needed.  I answered all the  questions today and the patient and her husband were in agreement. I spent 45 minutes in total face-to-face time and in reviewing records during pre-charting, more than 50% of which was spent in counseling and coordination of care, reviewing test results, reviewing medications and treatment regimen and/or in discussing or reviewing the diagnosis of MCI, stress, the prognosis and treatment options. Pertinent laboratory and imaging test results that were available during this visit with the patient were reviewed by me and considered in my medical decision making (see chart for details).

## 2022-06-20 DIAGNOSIS — H2513 Age-related nuclear cataract, bilateral: Secondary | ICD-10-CM | POA: Diagnosis not present

## 2022-07-02 DIAGNOSIS — D519 Vitamin B12 deficiency anemia, unspecified: Secondary | ICD-10-CM | POA: Diagnosis not present

## 2022-07-02 DIAGNOSIS — K219 Gastro-esophageal reflux disease without esophagitis: Secondary | ICD-10-CM | POA: Diagnosis not present

## 2022-07-02 DIAGNOSIS — E7849 Other hyperlipidemia: Secondary | ICD-10-CM | POA: Diagnosis not present

## 2022-07-02 DIAGNOSIS — Z1329 Encounter for screening for other suspected endocrine disorder: Secondary | ICD-10-CM | POA: Diagnosis not present

## 2022-07-02 DIAGNOSIS — E559 Vitamin D deficiency, unspecified: Secondary | ICD-10-CM | POA: Diagnosis not present

## 2022-07-02 DIAGNOSIS — N184 Chronic kidney disease, stage 4 (severe): Secondary | ICD-10-CM | POA: Diagnosis not present

## 2022-07-02 DIAGNOSIS — E876 Hypokalemia: Secondary | ICD-10-CM | POA: Diagnosis not present

## 2022-07-02 DIAGNOSIS — I10 Essential (primary) hypertension: Secondary | ICD-10-CM | POA: Diagnosis not present

## 2022-07-10 DIAGNOSIS — N184 Chronic kidney disease, stage 4 (severe): Secondary | ICD-10-CM | POA: Diagnosis not present

## 2022-07-10 DIAGNOSIS — I10 Essential (primary) hypertension: Secondary | ICD-10-CM | POA: Diagnosis not present

## 2022-07-10 DIAGNOSIS — Z0001 Encounter for general adult medical examination with abnormal findings: Secondary | ICD-10-CM | POA: Diagnosis not present

## 2022-07-10 DIAGNOSIS — Z6824 Body mass index (BMI) 24.0-24.9, adult: Secondary | ICD-10-CM | POA: Diagnosis not present

## 2022-07-10 DIAGNOSIS — N2581 Secondary hyperparathyroidism of renal origin: Secondary | ICD-10-CM | POA: Diagnosis not present

## 2022-07-10 DIAGNOSIS — E876 Hypokalemia: Secondary | ICD-10-CM | POA: Diagnosis not present

## 2022-07-10 DIAGNOSIS — Z1231 Encounter for screening mammogram for malignant neoplasm of breast: Secondary | ICD-10-CM | POA: Diagnosis not present

## 2022-07-10 DIAGNOSIS — E7849 Other hyperlipidemia: Secondary | ICD-10-CM | POA: Diagnosis not present

## 2022-07-10 DIAGNOSIS — M4317 Spondylolisthesis, lumbosacral region: Secondary | ICD-10-CM | POA: Diagnosis not present

## 2022-07-10 DIAGNOSIS — R413 Other amnesia: Secondary | ICD-10-CM | POA: Diagnosis not present

## 2022-08-03 DIAGNOSIS — N1832 Chronic kidney disease, stage 3b: Secondary | ICD-10-CM | POA: Diagnosis not present

## 2022-08-07 DIAGNOSIS — Z13820 Encounter for screening for osteoporosis: Secondary | ICD-10-CM | POA: Diagnosis not present

## 2022-08-07 DIAGNOSIS — M810 Age-related osteoporosis without current pathological fracture: Secondary | ICD-10-CM | POA: Diagnosis not present

## 2022-08-19 ENCOUNTER — Other Ambulatory Visit: Payer: Self-pay | Admitting: Neurology

## 2022-09-27 DIAGNOSIS — D631 Anemia in chronic kidney disease: Secondary | ICD-10-CM | POA: Diagnosis not present

## 2022-09-27 DIAGNOSIS — N2581 Secondary hyperparathyroidism of renal origin: Secondary | ICD-10-CM | POA: Diagnosis not present

## 2022-09-27 DIAGNOSIS — N1832 Chronic kidney disease, stage 3b: Secondary | ICD-10-CM | POA: Diagnosis not present

## 2022-09-27 DIAGNOSIS — I129 Hypertensive chronic kidney disease with stage 1 through stage 4 chronic kidney disease, or unspecified chronic kidney disease: Secondary | ICD-10-CM | POA: Diagnosis not present

## 2022-09-27 DIAGNOSIS — N189 Chronic kidney disease, unspecified: Secondary | ICD-10-CM | POA: Diagnosis not present

## 2022-10-01 DIAGNOSIS — R739 Hyperglycemia, unspecified: Secondary | ICD-10-CM | POA: Diagnosis not present

## 2022-10-01 DIAGNOSIS — K219 Gastro-esophageal reflux disease without esophagitis: Secondary | ICD-10-CM | POA: Diagnosis not present

## 2022-10-01 DIAGNOSIS — E875 Hyperkalemia: Secondary | ICD-10-CM | POA: Diagnosis not present

## 2022-10-01 DIAGNOSIS — N184 Chronic kidney disease, stage 4 (severe): Secondary | ICD-10-CM | POA: Diagnosis not present

## 2022-10-01 DIAGNOSIS — I10 Essential (primary) hypertension: Secondary | ICD-10-CM | POA: Diagnosis not present

## 2022-10-08 DIAGNOSIS — M810 Age-related osteoporosis without current pathological fracture: Secondary | ICD-10-CM | POA: Diagnosis not present

## 2022-10-08 DIAGNOSIS — N2581 Secondary hyperparathyroidism of renal origin: Secondary | ICD-10-CM | POA: Diagnosis not present

## 2022-10-08 DIAGNOSIS — R413 Other amnesia: Secondary | ICD-10-CM | POA: Diagnosis not present

## 2022-10-08 DIAGNOSIS — E875 Hyperkalemia: Secondary | ICD-10-CM | POA: Diagnosis not present

## 2022-10-08 DIAGNOSIS — E7849 Other hyperlipidemia: Secondary | ICD-10-CM | POA: Diagnosis not present

## 2022-10-08 DIAGNOSIS — E876 Hypokalemia: Secondary | ICD-10-CM | POA: Diagnosis not present

## 2022-10-08 DIAGNOSIS — I10 Essential (primary) hypertension: Secondary | ICD-10-CM | POA: Diagnosis not present

## 2022-10-08 DIAGNOSIS — N1832 Chronic kidney disease, stage 3b: Secondary | ICD-10-CM | POA: Diagnosis not present

## 2022-10-08 DIAGNOSIS — M4317 Spondylolisthesis, lumbosacral region: Secondary | ICD-10-CM | POA: Diagnosis not present

## 2022-10-15 ENCOUNTER — Ambulatory Visit: Payer: Medicare PPO | Admitting: Adult Health

## 2022-10-15 ENCOUNTER — Encounter: Payer: Self-pay | Admitting: Adult Health

## 2022-10-15 VITALS — BP 160/74 | HR 60 | Ht 61.5 in | Wt 134.0 lb

## 2022-10-15 DIAGNOSIS — R413 Other amnesia: Secondary | ICD-10-CM

## 2022-10-15 DIAGNOSIS — Z818 Family history of other mental and behavioral disorders: Secondary | ICD-10-CM | POA: Diagnosis not present

## 2022-10-15 MED ORDER — MEMANTINE HCL 5 MG PO TABS: ORAL_TABLET | ORAL | 0 refills | Status: DC

## 2022-10-15 NOTE — Patient Instructions (Addendum)
Your Plan:  Increase Namenda gradually - recommend increasing to 5mg  twice daily for 1 week, then increase to 5mg  morning and 10mg  (2 5mg  tabs) evening for 1 week then increase to 10mg  (2 5mg  tabs) twice daily - please call office or send MyChart message with any difficulty tolerating. If doing well, please notify office after being on 10mg  twice daily dosing for 1 week and if tolerating well, I will send in a new prescription for 10mg  tablets  Referral placed to tailored brain health for neurocognitive testing - you will be called to be scheduled   Would recommend doing routine memory exercises and staying physical activity. Work on Clinical cytogeneticist as listed below.       Follow up in 6 months or call earlier if needed     Thank you for coming to see [place] at [facility]. I hope we have been able to provide you high quality care today.  You may receive a patient satisfaction survey over the next few weeks. We would appreciate your feedback and comments so that we may continue to improve ourselves and the health of our patients.    Memory Compensation Strategies  Use "WARM" strategy.  W= write it down  A= associate it  R= repeat it  M= make a mental note  2.   You can keep a Glass blower/designer.  Use a 3-ring notebook with sections for the following: calendar, important names and phone numbers,  medications, doctors' names/phone numbers, lists/reminders, and a section to journal what you did  each day.   3.    Use a calendar to write appointments down.  4.    Write yourself a schedule for the day.  This can be placed on the calendar or in a separate section of the Memory Notebook.  Keeping a  regular schedule can help memory.  5.    Use medication organizer with sections for each day or morning/evening pills.  You may need help loading it  6.    Keep a basket, or pegboard by the door.  Place items that you need to take out with you in the basket or on the  pegboard.  You may also want to  include a message board for reminders.  7.    Use sticky notes.  Place sticky notes with reminders in a place where the task is performed.  For example: " turn off the  stove" placed by the stove, "lock the door" placed on the door at eye level, " take your medications" on  the bathroom mirror or by the place where you normally take your medications.  8.    Use alarms/timers.  Use while cooking to remind yourself to check on food or as a reminder to take your medicine, or as a  reminder to make a call, or as a reminder to perform another task, etc.

## 2022-10-15 NOTE — Progress Notes (Signed)
[facility] [address] [place]. [place] [address] [phone]       OFFICE FOLLOW UP NOTE  [name] Date of Birth:  01-Apr-1950 Medical Record Number:  [id number]    Primary neurologist: Dr. Frances Furbish Reason for visit: MCI    SUBJECTIVE:   CHIEF COMPLAINT:  Chief Complaint  Patient presents with   Follow-up    Rm 3, here with husband Dennard Nip Pt is following up on memory. Pt's husband states everyday is different, some days are better than others. Pt states her memory seems to be worse when she is overwhelmed. Husband states pt's memory has gotten a little worse since last ov.    Follow-up visit:  Prior visit: 05/09/2022 with Dr. Frances Furbish  Brief HPI:   Jody Russell is a 72 y.o. female who was initially evaluated by Dr. Frances Furbish on 01/22/2022 for complaints of several month history of forgetfulness and confusion at times.  Reported family history of Alzheimer's dementia affecting her mother.  She also mention falls and imbalance.  Longstanding history of cervical and lumbar issues felt to be contributing to gait impairment/imbalance.  MRI brain 08/2021 chronic mild small vessel ischemia.  Lab work in 12/2021 showed abnormal liver function, worsening of kidney impairment and anemia. Was advised to be seen by nephrology.  Also advised to follow-up with OB/GYN for dysfunctional uterine bleeding as well as ongoing follow-up with neurosurgery.  Recommended obtaining B12, A1c and hepatitis profile at follow-up visit with PCP.   At prior visit, continued to have memory difficulties with intermittent confusion and word finding difficulty.  MMSE 26/30 (prior 27/30).  Recommend initiating low-dose memantine for suspected mild cognitive impairment/mild dementia.  She did have evaluation nephrology, neurosurgery and OB/GYN.   Interval history:  Accompanied today by her husband. Reports some gradual decline since prior visit, more so with short-term memory, reports difficulty  with organization, can easily get frustrated with herself. Per husband, can fluctuate from day-to-day. MMSE today 25/30.  Current use of Namenda 5 mg daily, tolerating well.  Does stay active participating in tai chi every Tuesday, dance every Thursday and church on Sunday.  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  Reports sleeping well, good appetite and adequate water intake.  No behavioral concerns.   Of note, blood pressure elevated today, asymptomatic.  Occasionally monitors at home and reports more recently BP has been elevated.  Is routinely followed by nephrology and saw PCP last month.     ROS:   14 system review of systems performed and negative with exception of those listed in HPI  PMH:  Past Medical History:  Diagnosis Date   Aortic atherosclerosis (HCC)    CKD (chronic kidney disease)    DDD (degenerative disc disease), cervical    DDD (degenerative disc disease), lumbar    Depression    Diverticulosis    Dizziness    GERD (gastroesophageal reflux disease)    HTN (hypertension)    Hyperlipidemia    IBS (irritable bowel syndrome)    Memory change    Paresthesia of both hands    Sciatica     PSH:  Past Surgical History:  Procedure Laterality Date   APPENDECTOMY  2011   COLONOSCOPY  12/12/2012   DILATION AND CURETTAGE OF UTERUS     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    Social History:  Social History   Socioeconomic History   Marital status: Married  Spouse name: Dennard Nip   Number of children: 2   Years of education: Not on file   Highest education level: Not on file  Occupational History    Comment: retired  Tobacco Use   Smoking status: Never   Smokeless tobacco: Never  Substance and Sexual Activity   Alcohol use: Never   Drug use: Never   Sexual activity: Not on file  Other Topics Concern   Not on file  Social History Narrative   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   Social  Determinants of Health   Financial Resource Strain: Not on file  Food Insecurity: Not on file  Transportation Needs: Not on file  Physical Activity: Not on file  Stress: Not on file  Social Connections: Not on file  Intimate Partner Violence: Not At Risk (07/14/2020)   Received from [facility], [facility]   Humiliation, Afraid, Rape, and Kick questionnaire    Fear of Current or Ex-Partner: No    Emotionally Abused: No    Physically Abused: No    Sexually Abused: No    Family History:  Family History  Problem Relation Age of Onset   Alzheimer's disease Mother    Kidney failure Father    Cervical cancer Sister    Diabetes Brother    Kidney disease Brother    Ovarian cancer Other    Colon cancer Other     Medications:   Current Outpatient Medications on File Prior to Visit  Medication Sig Dispense Refill   amLODipine (NORVASC) 5 MG tablet Take 5 mg by mouth daily.     Calcium Citrate-Vitamin D (CALCIUM + D PO) Take by mouth.     FLUoxetine (PROZAC) 40 MG capsule Take 40 mg by mouth daily.     metoprolol succinate (TOPROL-XL) 25 MG 24 hr tablet Take 25 mg by mouth daily.     omeprazole (PRILOSEC) 20 MG capsule Take 20 mg by mouth 2 (two) times daily.     rosuvastatin (CRESTOR) 10 MG tablet Take 10 mg by mouth 3 (three) times a week.     No current facility-administered medications on file prior to visit.    Allergies:   Allergies  Allergen Reactions   Lisinopril     Other reaction(s): Other (See Comments) Elevated potassium level   Statins     Other reaction(s): Muscle Pain Joint and muscle aches      OBJECTIVE:  Physical Exam  Vitals:   10/15/22 1509 10/15/22 1605  BP: (!) 163/66 (!) 160/74  Pulse: 60   Weight: 134 lb (60.8 kg)   Height: 5' 1.5" (1.562 m)    Body mass index is 24.91 kg/m. No results found.   General: well developed, well nourished, very pleasant elderly Caucasian female, seated, in no evident distress Head: head  normocephalic and atraumatic.   Neck: supple with no carotid or supraclavicular bruits Cardiovascular: regular rate and rhythm, no murmurs Musculoskeletal: no deformity Skin:  no rash/petichiae Vascular:  Normal pulses all extremities   Neurologic Exam Mental Status: Awake and fully alert. Oriented to place and time. Recent memory impaired and remote memory intact. Attention span, concentration and fund of knowledge mostly appropriate but will repeat questions or ask for clarification. Mood and affect appropriate.  Cranial Nerves: Pupils equal, briskly reactive to light. Extraocular movements full without nystagmus. Visual fields full to confrontation. Hearing intact. Facial sensation intact. Face, tongue, palate moves normally and symmetrically.  Motor: Normal bulk and tone. Normal strength in all tested extremity muscles  Sensory.: intact to touch , pinprick , position and vibratory sensation.  Coordination: Rapid alternating movements normal in all extremities. Finger-to-nose and heel-to-shin performed accurately bilaterally. Gait and Station: Arises from chair without difficulty. Stance is normal. Gait demonstrates slow cautious gait with use of cane Reflexes: 1+ and symmetric. Toes downgoing.       10/15/2022    3:12 PM 05/09/2022   10:55 AM 01/22/2022    3:40 PM  MMSE - Mini Mental State Exam  Orientation to time 4 4 5   Orientation to Place 5 5 4   Registration 3 3 3   Attention/ Calculation 3 3 3   Recall 3 3 3   Language- name 2 objects 2 2 2   Language- repeat 1 1 1   Language- follow 3 step command 2 2 3   Language- read & follow direction 1 1 1   Write a sentence 1 1 1   Copy design 0 1 1  Total score 25 26 27          ASSESSMENT/PLAN: Jody Russell is a 72 y.o. year old female with progressive memory loss since mid 2023.     Mild cognitive impairment: Recommend gradually increasing memantine to 10mg  twice daily, titration schedule provided in AVS. advised to call with any  difficulty tolerating.  Advised to call after 1 week on next dose and will send in a rx for 10mg  tablets Referral placed to neuropsychology ([facility]) for neurocognitive testing, fm hx of Alzheimer's Lab work 06/2022 vit D 66.7, B12 1348, TSH 2.6 (obtained by PCP) MRI brain 08/2021 mild chronic small vessel ischemia in the hemispheric white matter, age normal brain volume  Encouraged increasing memory exercises at home as well as staying physically active, provided compensation strategy information  HTN Blood pressure elevated today, asymptomatic.  Advised to follow-up with PCP or nephrology for further evaluation/recommendations    Follow up in 6 months or call earlier if needed   CC:  PCP: Burdine, Ananias Pilgrim, MD    I spent 46 minutes of face-to-face and non-face-to-face time with patient and husband.  This included previsit chart review, lab review, study review, order entry, electronic health record documentation, patient and husband education and discussion regarding above diagnoses and treatment plan and answered all other questions to patient and husband satisfaction  Ihor Austin, AGNP-BC  [facility] [address] [place], [place] [address]  Phone [phone] Fax [phone] Note: This document was prepared with digital dictation and possible smart phrase technology. Any transcriptional errors that result from this process are unintentional.

## 2022-10-17 ENCOUNTER — Telehealth: Payer: Self-pay | Admitting: Adult Health

## 2022-10-17 NOTE — Telephone Encounter (Signed)
Scheduled with Dr. Rueben Bash 10/21 and 11/6.

## 2022-10-17 NOTE — Telephone Encounter (Signed)
Referral for neuropsychology fax to [facility]. Phone: [phone], Fax: [phone]. 

## 2022-11-27 ENCOUNTER — Other Ambulatory Visit: Payer: Self-pay | Admitting: Adult Health

## 2022-11-27 DIAGNOSIS — R413 Other amnesia: Secondary | ICD-10-CM

## 2022-12-04 ENCOUNTER — Telehealth: Payer: Self-pay | Admitting: Adult Health

## 2022-12-04 NOTE — Telephone Encounter (Signed)
Pt has been informed that re: the referral to [facility], a PA is needed and  that Dr Frances Furbish needs to call [facility] re: needed PA, # to [facility] [phone]

## 2022-12-06 NOTE — Telephone Encounter (Signed)
Pt was called and the message from [place], [place] was relayed.

## 2022-12-21 ENCOUNTER — Telehealth: Payer: Self-pay | Admitting: Adult Health

## 2022-12-21 NOTE — Telephone Encounter (Signed)
Jody Russell from [facility] called wanting to inform the provider that the pt cx her upcoming appt on Monday the 21st.

## 2022-12-24 NOTE — Telephone Encounter (Signed)
Noted! Thank you

## 2023-01-18 DIAGNOSIS — Z1329 Encounter for screening for other suspected endocrine disorder: Secondary | ICD-10-CM | POA: Diagnosis not present

## 2023-01-18 DIAGNOSIS — K219 Gastro-esophageal reflux disease without esophagitis: Secondary | ICD-10-CM | POA: Diagnosis not present

## 2023-01-18 DIAGNOSIS — E7849 Other hyperlipidemia: Secondary | ICD-10-CM | POA: Diagnosis not present

## 2023-01-18 DIAGNOSIS — E559 Vitamin D deficiency, unspecified: Secondary | ICD-10-CM | POA: Diagnosis not present

## 2023-01-18 DIAGNOSIS — N184 Chronic kidney disease, stage 4 (severe): Secondary | ICD-10-CM | POA: Diagnosis not present

## 2023-01-18 DIAGNOSIS — E875 Hyperkalemia: Secondary | ICD-10-CM | POA: Diagnosis not present

## 2023-01-25 DIAGNOSIS — N2581 Secondary hyperparathyroidism of renal origin: Secondary | ICD-10-CM | POA: Diagnosis not present

## 2023-01-25 DIAGNOSIS — I10 Essential (primary) hypertension: Secondary | ICD-10-CM | POA: Diagnosis not present

## 2023-01-25 DIAGNOSIS — E7849 Other hyperlipidemia: Secondary | ICD-10-CM | POA: Diagnosis not present

## 2023-01-25 DIAGNOSIS — R413 Other amnesia: Secondary | ICD-10-CM | POA: Diagnosis not present

## 2023-01-25 DIAGNOSIS — N184 Chronic kidney disease, stage 4 (severe): Secondary | ICD-10-CM | POA: Diagnosis not present

## 2023-01-25 DIAGNOSIS — M4317 Spondylolisthesis, lumbosacral region: Secondary | ICD-10-CM | POA: Diagnosis not present

## 2023-01-25 DIAGNOSIS — M810 Age-related osteoporosis without current pathological fracture: Secondary | ICD-10-CM | POA: Diagnosis not present

## 2023-01-25 DIAGNOSIS — Z6826 Body mass index (BMI) 26.0-26.9, adult: Secondary | ICD-10-CM | POA: Diagnosis not present

## 2023-01-25 DIAGNOSIS — E875 Hyperkalemia: Secondary | ICD-10-CM | POA: Diagnosis not present

## 2023-04-05 DIAGNOSIS — I129 Hypertensive chronic kidney disease with stage 1 through stage 4 chronic kidney disease, or unspecified chronic kidney disease: Secondary | ICD-10-CM | POA: Diagnosis not present

## 2023-04-05 DIAGNOSIS — N1832 Chronic kidney disease, stage 3b: Secondary | ICD-10-CM | POA: Diagnosis not present

## 2023-04-05 DIAGNOSIS — I890 Lymphedema, not elsewhere classified: Secondary | ICD-10-CM | POA: Diagnosis not present

## 2023-04-05 DIAGNOSIS — N2581 Secondary hyperparathyroidism of renal origin: Secondary | ICD-10-CM | POA: Diagnosis not present

## 2023-04-05 DIAGNOSIS — D631 Anemia in chronic kidney disease: Secondary | ICD-10-CM | POA: Diagnosis not present

## 2023-04-23 ENCOUNTER — Ambulatory Visit: Payer: Medicare PPO | Admitting: Adult Health

## 2023-05-03 DIAGNOSIS — N1832 Chronic kidney disease, stage 3b: Secondary | ICD-10-CM | POA: Diagnosis not present

## 2023-05-12 ENCOUNTER — Other Ambulatory Visit: Payer: Self-pay | Admitting: Adult Health

## 2023-05-12 DIAGNOSIS — R413 Other amnesia: Secondary | ICD-10-CM

## 2023-05-13 NOTE — Telephone Encounter (Signed)
 Last seen on 10/15/22 No 6 month follow up scheduled

## 2023-05-22 NOTE — Telephone Encounter (Signed)
 Informed pt Rx has already been sent to pharmacy on 05/13/23. Pt scheduled follow up visit on 06/26/23. Pt informed to reach out to the pharmacy.

## 2023-05-22 NOTE — Telephone Encounter (Signed)
 Pt has scheduled the needed 6 mo f/u and would like to know if the memantine (NAMENDA) 10 MG tablet  can now be called into [facility]

## 2023-05-25 ENCOUNTER — Other Ambulatory Visit: Payer: Self-pay | Admitting: Adult Health

## 2023-05-25 DIAGNOSIS — R413 Other amnesia: Secondary | ICD-10-CM

## 2023-06-26 ENCOUNTER — Ambulatory Visit: Admitting: Adult Health

## 2023-06-26 ENCOUNTER — Encounter: Payer: Self-pay | Admitting: Adult Health

## 2023-06-26 VITALS — BP 169/69 | HR 55 | Ht 62.0 in | Wt 144.0 lb

## 2023-06-26 DIAGNOSIS — G3184 Mild cognitive impairment, so stated: Secondary | ICD-10-CM

## 2023-06-26 DIAGNOSIS — Z820 Family history of epilepsy and other diseases of the nervous system: Secondary | ICD-10-CM

## 2023-06-26 MED ORDER — MEMANTINE HCL 5 MG PO TABS: 5.0000 mg | ORAL_TABLET | Freq: Two times a day (BID) | ORAL | 0 refills | Status: DC

## 2023-06-26 NOTE — Progress Notes (Signed)
 [facility] [address] [place]. [place] [address] [phone]       OFFICE FOLLOW UP NOTE  [name] Date of Birth:  [birth year] Medical Record Number:  [id number]    Primary neurologist: Dr. Omar Bibber Reason for visit: [facility]    SUBJECTIVE:   CHIEF COMPLAINT:  Chief Complaint  Patient presents with   Follow-up    Pt with husband, rm 8. She notices that when she gets overwhelmed or over stressed she tends to States that a couple weeks ago she had a mix up in taking her medication and had took to much.     Follow-up visit:  Prior visit: 05/09/2022 with Dr. Omar Bibber  Brief HPI:   Jody Russell is a 73 y.o. female who was initially evaluated by Dr. Omar Bibber on 01/22/2022 for complaints of several month history of forgetfulness and confusion at times.  Reported family history of Alzheimer's dementia affecting her mother.  She also mention falls and imbalance.  Longstanding history of cervical and lumbar issues felt to be contributing to gait impairment/imbalance.  MRI brain 08/2021 chronic mild small vessel ischemia.  Lab work in 12/2021 showed abnormal liver function, worsening of kidney impairment and anemia. Was advised to be seen by nephrology.  Also advised to follow-up with OB/GYN for dysfunctional uterine bleeding as well as ongoing follow-up with neurosurgery.  Recommended obtaining B12, A1c and hepatitis profile at follow-up visit with PCP.   At prior visit, reported continued cognitive decline.  Referred to neuropsychology for further evaluation.  Recommended further increasing memantine  dosage gradually to 10 mg twice daily.    Interval history:  Returns today for follow up visit accompanied by her husband. Both patient and husband believe memory has been stable. MMSE 27/30 (prior 25/30). Did not pursue neurocog evaluation due to cost ($1600 for testing). Does have fm hx of AD (paternal and maternal cousins and mother, father passed away in his 31's).  She has  remained on memantine , was previously on 5mg  BID and increased to 10mg  BID at prior visit. She picked up refill on 4/7 and just recently realized she has been taking 2 10mg  tablets twice daily over the past 2 weeks and has since ran out of medication. She was feeling some dizziness from this.  Continues to be fairly active routinely going to a senior center doing tai chi classes, continues to do dance classes and still assist as needed with Sunday school.  Reports she sleeps well and adequate appetite.     ROS:   14 system review of systems performed and negative with exception of those listed in HPI  PMH:  Past Medical History:  Diagnosis Date   Aortic atherosclerosis (HCC)    CKD (chronic kidney disease)    DDD (degenerative disc disease), cervical    DDD (degenerative disc disease), lumbar    Depression    Diverticulosis    Dizziness    GERD (gastroesophageal reflux disease)    HTN (hypertension)    Hyperlipidemia    IBS (irritable bowel syndrome)    Memory change    Paresthesia of both hands    Sciatica     PSH:  Past Surgical History:  Procedure Laterality Date   APPENDECTOMY  2011   COLONOSCOPY  12/12/2012   DILATION AND CURETTAGE OF UTERUS     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    Social History:  Social History   Socioeconomic History   Marital status: Married  Spouse name: Jody Russell   Number of children: 2   Years of education: Not on file   Highest education level: Not on file  Occupational History    Comment: retired  Tobacco Use   Smoking status: Never   Smokeless tobacco: Never  Substance and Sexual Activity   Alcohol use: Never   Drug use: Never   Sexual activity: Not on file  Other Topics Concern   Not on file  Social History Narrative   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   Social Drivers of Corporate investment banker Strain: Not on file  Food Insecurity: Not on file  Transportation Needs: Not on file  Physical  Activity: Not on file  Stress: Not on file  Social Connections: Not on file  Intimate Partner Violence: Not At Risk (07/14/2020)   Received from [facility]   Humiliation, Afraid, Rape, and Kick questionnaire    Fear of Current or Ex-Partner: No    Emotionally Abused: No    Physically Abused: No    Sexually Abused: No    Family History:  Family History  Problem Relation Age of Onset   Alzheimer's disease Mother    Kidney failure Father    Cervical cancer Sister    Diabetes Brother    Kidney disease Brother    Ovarian cancer Other    Colon cancer Other     Medications:   Current Outpatient Medications on File Prior to Visit  Medication Sig Dispense Refill   amLODipine (NORVASC) 5 MG tablet Take 5 mg by mouth daily.     Calcium Citrate-Vitamin D (CALCIUM + D PO) Take by mouth.     FLUoxetine (PROZAC) 40 MG capsule Take 40 mg by mouth daily.     memantine  (NAMENDA ) 10 MG tablet TAKE 1 TABLET BY MOUTH TWICE DAILY 60 tablet 3   metoprolol succinate (TOPROL-XL) 25 MG 24 hr tablet Take 25 mg by mouth daily.     omeprazole (PRILOSEC) 20 MG capsule Take 20 mg by mouth 2 (two) times daily.     rosuvastatin (CRESTOR) 10 MG tablet Take 10 mg by mouth daily.     No current facility-administered medications on file prior to visit.    Allergies:   Allergies  Allergen Reactions   Lisinopril     Other reaction(s): Other (See Comments) Elevated potassium level   Statins     Other reaction(s): Muscle Pain Joint and muscle aches      OBJECTIVE:  Physical Exam  Vitals:   06/26/23 1259  BP: (!) 169/69  Pulse: (!) 55  Weight: 144 lb (65.3 kg)  Height: 5\' 2"  (1.575 m)   Body mass index is 26.34 kg/m. No results found.  General: well developed, well nourished, very pleasant elderly Caucasian female, seated, in no evident distress  Neurologic Exam Mental Status: Awake and fully alert. Oriented to place and time. Recent memory impaired and remote memory  intact. Attention span, concentration and fund of knowledge mostly appropriate but will repeat questions or ask for clarification. Mood and affect appropriate.  Cranial Nerves: Pupils equal, briskly reactive to light. Extraocular movements full without nystagmus. Visual fields full to confrontation. Hearing intact. Facial sensation intact. Face, tongue, palate moves normally and symmetrically.  Motor: Normal bulk and tone. Normal strength in all tested extremity muscles Sensory.: intact to touch , pinprick , position and vibratory sensation.  Coordination: Rapid alternating movements normal in all extremities. Finger-to-nose and heel-to-shin performed accurately bilaterally. Gait and Station: [employer]  from chair without difficulty. Stance is normal. Gait demonstrates adequate stride length and balance without use of AD Reflexes: 1+ and symmetric. Toes downgoing.       06/26/2023    1:03 PM 10/15/2022    3:12 PM 05/09/2022   10:55 AM  MMSE - Mini Mental State Exam  Orientation to time 5 4 4   Orientation to Place 5 5 5   Registration 3 3 3   Attention/ Calculation 2 3 3   Recall 3 3 3   Language- name 2 objects 2 2 2   Language- repeat 1 1 1   Language- follow 3 step command 3 2 2   Language- read & follow direction 1 1 1   Write a sentence 1 1 1   Copy design 1 0 1  Total score 27 25 26          ASSESSMENT/PLAN: Solaris Kram is a 73 y.o. year old female with progressive memory loss since mid 2023.     Mild cognitive impairment: MMSE today 27/30 (prior 25/30) Continue memantine  10 mg twice daily, will send in a new refill for 5mg  tablets to take 2 tabs twice daily over the next 2 weeks then will send in new rx for 10mg  tablets twice daily (recently accidentally taking 2 10 mg tablets twice daily and ran out of rx early) Due to family history of Alzheimer disease, will complete ATN profile.  If positive, will proceed with PET scan.  She is interested in possibly pursuing antiamyloid therapy  based on testing therefore we will also complete APOE. Discussed potential side effects of antiamyloid therapy such as ARIA and indication for routine MRI scans.  Patient and husband verbalized understanding.  She was advised currently based on MMSE today, she would not qualify for treatment but could bring her back in in 3 months for repeat testing as insurance requires mild impairment with MMSE between 20-25.  MRI brain 08/2021 mild chronic small vessel ischemia in the hemispheric white matter, age normal brain volume  Encouraged increasing memory exercises at home as well as staying physically active, provided compensation strategy information  HTN Blood pressure elevated today, asymptomatic.  Advised to follow-up with PCP or nephrology for further evaluation/recommendations    Follow up in 6 months or call earlier if needed   CC:  PCP: Burdine, Maceo Sax, MD    I spent 40 minutes of face-to-face and non-face-to-face time with patient and husband.  This included previsit chart review, lab review, study review, order entry, electronic health record documentation, patient and husband education and discussion regarding above diagnoses and treatment plan and answered all other questions to patient and husband satisfaction  Johny Nap, AGNP-BC  [facility] [address] [place], [place] [address]  Phone [phone] Fax [phone] Note: This document was prepared with digital dictation and possible smart phrase technology. Any transcriptional errors that result from this process are unintentional.

## 2023-06-26 NOTE — Patient Instructions (Addendum)
 Your Plan:  Continue Namenda  - take 2 5mg  tablets twice daily for the next 2 weeks, I will send in a new prescription beginning of May for 10mg  tablets - please ensure you only take 1 tablet twice daily once you receive the 10mg  prescription   We will check lab work to look for possible Alzheimer's disease      Follow up in 6 months or call earlier if needed     Thank you for coming to see us  at [facility]. I hope we have been able to provide you high quality care today.  You may receive a patient satisfaction survey over the next few weeks. We would appreciate your feedback and comments so that we may continue to improve ourselves and the health of our patients.     Alzheimer's Disease Alzheimer's disease is a disease that affects the way your brain works. It affects memory and causes changes in how you think, talk, and act. The disease gets worse over time. Alzheimer's disease is a form of dementia. What are the causes? Alzheimer's disease happens when a protein called beta-amyloid forms deposits in the brain. It's not known what causes these to form. The disease may also be caused by a harmful change in a gene that's passed down, or inherited, from one or both parents. Not everyone who gets the changed gene from their parents will get the disease. What increases the risk? Being older than 72 years of age. Being female. Having any of these conditions: High blood pressure. Diabetes. Heart or blood vessel disease. Smoking. Being very overweight. Having a brain injury or a stroke in the past. Having a family history of dementia. What are the signs or symptoms?  Symptoms may happen in three stages, which often overlap. Early stage In this stage, you can still do things on your own. You may still be able to drive, work, and be social. Symptoms in this stage include: Forgetting small things, like a name, words, or what you did recently. Having a hard time  with: Paying attention or learning new things. Talking with people. Doing your usual tasks. Solving problems or doing math. Following instructions. Feeling worried or nervous. Not wanting to be around people. Losing interest in doing things. Moderate stage In this stage, you'll start to need care. Symptoms include: Trouble saying what you're thinking. Memory loss that affects daily life. This can include forgetting: Things that happened recently. If you've taken medicines or eaten. Places you know. You may get lost while walking or driving. To pay bills. To bathe or use the bathroom. Being confused about where you are or what time it is. Trouble judging distance. Changes in how you feel or act. You may be moody, angry, upset, scared, worried, or suspicious. Not thinking clearly or making good choices. You may have delusions or false beliefs. Hallucinations. This means you see, hear, taste, smell, or feel things that aren't real. Severe stage In the severe stage, you'll need help with your personal care and daily activities. Symptoms include: Memory loss getting worse. Personality changes. Not knowing where you are. Physical problems, like trouble walking, sitting, or swallowing. More trouble talking with others. Not being able to control when you pee (urinate) or poop. More changes in how you act. How is this diagnosed? You may need to see a nervous system specialist, called a neurologist, or a health care provider who focuses on the care of older adults. Alzheimer's disease may be diagnosed based on: Your symptoms  and medical history. Your provider will talk with you and your family, friends, and caregivers about your history and symptoms. A physical exam. Tests. These may include: Lab tests, such as tests on your blood or pee. Imaging tests. You may have a CT scan, a PET scan, or an MRI. A lumbar puncture. For this test, a sample of the fluid around the brain and spinal cord  is taken and tested. Tests to check your thinking and memory. Genetic testing. This may be done if you get the disease before age 19 or if other family members have had the disease. How is this treated? At this time, there's no cure for Alzheimer's disease. The goals of treatment are to: Manage symptoms that affect behavior. Make sure you're safe at home. Help manage daily life for you and your caregivers. Treatment may include: Medicines. You may get medicines that can: Slow down how fast the disease gets worse. Help with memory and behavior. Cognitive therapy. This gives you support, training to help with thinking skills, and memory aids. Counseling or spiritual guidance. These can help you deal with the many feelings you may have, such as fear, anger, or feeling alone. Caregivers. These are people who help you with your daily tasks. Family support groups. These allow your family members to learn about the disease, get emotional support, and find out about resources to help take care of you. Follow these instructions at home:  Medicines Take over-the-counter and prescription medicines only as told by your provider. Use a pill organizer or pill reminder to help you keep track of your medicines. Avoid taking medicines that can affect thinking, such as medicines for pain or sleep. Lifestyle Make healthy choices: Be active as told by your provider. Regular exercise may help with symptoms. Do not smoke, vape, or use products with nicotine or tobacco in them. Do not drink alcohol. Eat a healthy diet. When you feel a lot of stress, do something that helps you relax. Try things like yoga or deep breathing. Spend time with people. Drink enough fluid to keep your pee pale yellow. Make sure you sleep well. These tips can help: Try not to take long naps during the day. Take short naps of 30 minutes or less if needed. Keep your bedroom dark and cool. Do not exercise during the few hours before  you go to bed. Avoid caffeine products in the afternoon and evening. General instructions Work with your provider to decide: What things you need help with. What your safety needs are. If you were given a bracelet that tracks where you are and shows that you're a person with memory loss, make sure you wear it at all times. Talk with your provider about whether it's safe for you to drive. Work with your family to make big legal or health decisions. This may include things like advance directives, medical power of attorney, or a living will. Where to find more information There are two ways to contact the Alzheimer's Association: Call the 24-hour helpline at 947-791-9418. Visit [employer] Contact a health care provider if: Your medicine causes you to have nausea, vomiting, or trouble with eating. You have mood or behavior changes that are getting worse, such as feeling depressed, worried, or nervous. You have hallucinations. You or your family members are worried about your safety. You're hard to wake up. Your memory suddenly gets worse. Get help right away if: You feel like you may hurt yourself or others. You have thoughts about taking your own  life. Take one of these steps: Go to your nearest emergency room. Call 911. Call the National Suicide Prevention Lifeline at 332-700-7651 or 988. Text the Crisis Text Line at (620)008-0207. This information is not intended to replace advice given to you by your health care provider. Make sure you discuss any questions you have with your health care provider. Document Revised: 05/22/2022 Document Reviewed: 05/22/2022 Elsevier Patient Education  2024 [employer].

## 2023-07-05 ENCOUNTER — Encounter: Payer: Self-pay | Admitting: Adult Health

## 2023-07-05 LAB — ATN PROFILE
A -- Beta-amyloid 42/40 Ratio: 0.108 (ref >0.102)
Beta-amyloid 40: 263.33 pg/mL
Beta-amyloid 42: 28.54 pg/mL
N -- NfL, Plasma: 8.42 pg/mL — ABNORMAL HIGH (ref 0.00–7.64)
T -- p-tau181: 3.67 pg/mL — ABNORMAL HIGH (ref 0.00–0.97)

## 2023-07-05 LAB — APOE ALZHEIMER'S RISK

## 2023-07-09 ENCOUNTER — Other Ambulatory Visit: Payer: Self-pay | Admitting: Adult Health

## 2023-07-09 DIAGNOSIS — Z820 Family history of epilepsy and other diseases of the nervous system: Secondary | ICD-10-CM

## 2023-07-09 DIAGNOSIS — G3184 Mild cognitive impairment, so stated: Secondary | ICD-10-CM

## 2023-07-09 MED ORDER — MEMANTINE HCL 10 MG PO TABS: 10.0000 mg | ORAL_TABLET | Freq: Two times a day (BID) | ORAL | 3 refills | Status: DC

## 2023-07-12 MED ORDER — MEMANTINE HCL 10 MG PO TABS: 10.0000 mg | ORAL_TABLET | Freq: Two times a day (BID) | ORAL | 1 refills | Status: DC

## 2023-07-22 DIAGNOSIS — E7849 Other hyperlipidemia: Secondary | ICD-10-CM | POA: Diagnosis not present

## 2023-07-22 DIAGNOSIS — R739 Hyperglycemia, unspecified: Secondary | ICD-10-CM | POA: Diagnosis not present

## 2023-07-22 DIAGNOSIS — D519 Vitamin B12 deficiency anemia, unspecified: Secondary | ICD-10-CM | POA: Diagnosis not present

## 2023-07-22 DIAGNOSIS — N184 Chronic kidney disease, stage 4 (severe): Secondary | ICD-10-CM | POA: Diagnosis not present

## 2023-07-22 DIAGNOSIS — E7801 Familial hypercholesterolemia: Secondary | ICD-10-CM | POA: Diagnosis not present

## 2023-07-22 DIAGNOSIS — E875 Hyperkalemia: Secondary | ICD-10-CM | POA: Diagnosis not present

## 2023-07-30 DIAGNOSIS — Z1389 Encounter for screening for other disorder: Secondary | ICD-10-CM | POA: Diagnosis not present

## 2023-07-30 DIAGNOSIS — R739 Hyperglycemia, unspecified: Secondary | ICD-10-CM | POA: Diagnosis not present

## 2023-07-30 DIAGNOSIS — Z1331 Encounter for screening for depression: Secondary | ICD-10-CM | POA: Diagnosis not present

## 2023-07-30 DIAGNOSIS — Z6822 Body mass index (BMI) 22.0-22.9, adult: Secondary | ICD-10-CM | POA: Diagnosis not present

## 2023-07-30 DIAGNOSIS — E875 Hyperkalemia: Secondary | ICD-10-CM | POA: Diagnosis not present

## 2023-07-30 DIAGNOSIS — Z0001 Encounter for general adult medical examination with abnormal findings: Secondary | ICD-10-CM | POA: Diagnosis not present

## 2023-07-30 DIAGNOSIS — Z6826 Body mass index (BMI) 26.0-26.9, adult: Secondary | ICD-10-CM | POA: Diagnosis not present

## 2023-07-30 DIAGNOSIS — R232 Flushing: Secondary | ICD-10-CM | POA: Diagnosis not present

## 2023-07-30 DIAGNOSIS — Z0000 Encounter for general adult medical examination without abnormal findings: Secondary | ICD-10-CM | POA: Diagnosis not present

## 2023-09-19 DIAGNOSIS — R300 Dysuria: Secondary | ICD-10-CM | POA: Diagnosis not present

## 2023-09-19 DIAGNOSIS — M545 Low back pain, unspecified: Secondary | ICD-10-CM | POA: Diagnosis not present

## 2023-09-19 DIAGNOSIS — Z6827 Body mass index (BMI) 27.0-27.9, adult: Secondary | ICD-10-CM | POA: Diagnosis not present

## 2023-09-27 DIAGNOSIS — R1032 Left lower quadrant pain: Secondary | ICD-10-CM | POA: Diagnosis not present

## 2023-09-27 DIAGNOSIS — K5730 Diverticulosis of large intestine without perforation or abscess without bleeding: Secondary | ICD-10-CM | POA: Diagnosis not present

## 2023-09-27 DIAGNOSIS — Z6826 Body mass index (BMI) 26.0-26.9, adult: Secondary | ICD-10-CM | POA: Diagnosis not present

## 2023-10-01 DIAGNOSIS — M419 Scoliosis, unspecified: Secondary | ICD-10-CM | POA: Diagnosis not present

## 2023-10-01 DIAGNOSIS — Z6827 Body mass index (BMI) 27.0-27.9, adult: Secondary | ICD-10-CM | POA: Diagnosis not present

## 2023-10-01 DIAGNOSIS — S39012A Strain of muscle, fascia and tendon of lower back, initial encounter: Secondary | ICD-10-CM | POA: Diagnosis not present

## 2023-10-04 ENCOUNTER — Other Ambulatory Visit (HOSPITAL_COMMUNITY)
Admission: RE | Admit: 2023-10-04 | Discharge: 2023-10-04 | Disposition: A | Discharge status: Home / Self Care | Source: Ambulatory Visit | Attending: Nephrology | Admitting: Nephrology

## 2023-10-04 DIAGNOSIS — N1832 Chronic kidney disease, stage 3b: Secondary | ICD-10-CM | POA: Insufficient documentation

## 2023-10-04 LAB — RENAL FUNCTION PANEL
Albumin: 4.4 g/dL (ref 3.5–5.0)
Anion gap: 16 — ABNORMAL HIGH (ref 5–15)
BUN: 34 mg/dL — ABNORMAL HIGH (ref 8–23)
CO2: 21 mmol/L — ABNORMAL LOW (ref 22–32)
Calcium: 9.6 mg/dL (ref 8.9–10.3)
Chloride: 97 mmol/L — ABNORMAL LOW (ref 98–111)
Creatinine, Ser: 1.84 mg/dL — ABNORMAL HIGH (ref 0.44–1.00)
GFR, Estimated: 29 mL/min — ABNORMAL LOW (ref >60)
Glucose, Bld: 111 mg/dL — ABNORMAL HIGH (ref 70–99)
Phosphorus: 3.7 mg/dL (ref 2.5–4.6)
Potassium: 4.9 mmol/L (ref 3.5–5.1)
Sodium: 134 mmol/L — ABNORMAL LOW (ref 135–145)

## 2023-10-04 LAB — PROTEIN / CREATININE RATIO, URINE
Creatinine, Urine: 25 mg/dL
Total Protein, Urine: <6 mg/dL

## 2023-10-07 LAB — PTH, INTACT AND CALCIUM
Calcium, Total (PTH): 9.9 mg/dL (ref 8.7–10.3)
PTH: 105 pg/mL — ABNORMAL HIGH (ref 15–65)

## 2023-10-07 LAB — HEMOGLOBIN A1C
Hgb A1c MFr Bld: 5.5 % (ref 4.8–5.6)
Mean Plasma Glucose: 111 mg/dL

## 2023-10-29 DIAGNOSIS — Z9181 History of falling: Secondary | ICD-10-CM | POA: Diagnosis not present

## 2023-10-29 DIAGNOSIS — Z7409 Other reduced mobility: Secondary | ICD-10-CM | POA: Diagnosis not present

## 2023-10-29 DIAGNOSIS — M6281 Muscle weakness (generalized): Secondary | ICD-10-CM | POA: Diagnosis not present

## 2023-10-29 DIAGNOSIS — R293 Abnormal posture: Secondary | ICD-10-CM | POA: Diagnosis not present

## 2023-10-29 DIAGNOSIS — R2689 Other abnormalities of gait and mobility: Secondary | ICD-10-CM | POA: Diagnosis not present

## 2023-10-29 DIAGNOSIS — M545 Low back pain, unspecified: Secondary | ICD-10-CM | POA: Diagnosis not present

## 2023-10-29 DIAGNOSIS — M2569 Stiffness of other specified joint, not elsewhere classified: Secondary | ICD-10-CM | POA: Diagnosis not present

## 2023-10-29 DIAGNOSIS — R29898 Other symptoms and signs involving the musculoskeletal system: Secondary | ICD-10-CM | POA: Diagnosis not present

## 2023-10-29 DIAGNOSIS — M5416 Radiculopathy, lumbar region: Secondary | ICD-10-CM | POA: Diagnosis not present

## 2023-11-07 ENCOUNTER — Other Ambulatory Visit (HOSPITAL_COMMUNITY)
Admission: RE | Admit: 2023-11-07 | Discharge: 2023-11-07 | Disposition: A | Discharge status: Home / Self Care | Source: Ambulatory Visit | Attending: Nephrology | Admitting: Nephrology

## 2023-11-07 DIAGNOSIS — N189 Chronic kidney disease, unspecified: Secondary | ICD-10-CM | POA: Insufficient documentation

## 2023-11-19 DIAGNOSIS — Z6826 Body mass index (BMI) 26.0-26.9, adult: Secondary | ICD-10-CM | POA: Diagnosis not present

## 2023-11-19 DIAGNOSIS — R1032 Left lower quadrant pain: Secondary | ICD-10-CM | POA: Diagnosis not present

## 2023-12-30 ENCOUNTER — Ambulatory Visit: Attending: Family Medicine | Admitting: Occupational Therapy

## 2023-12-30 DIAGNOSIS — I890 Lymphedema, not elsewhere classified: Secondary | ICD-10-CM | POA: Diagnosis not present

## 2023-12-30 NOTE — Therapy (Unsigned)
 OUTPATIENT OCCUPATIONAL THERAPY EVALUATION  BILATERAL LOWER EXTREMITY/ BILATERAL LOWER QUADRANT LYMPHEDEMA  Patient Name: Jody Russell MRN: [id number] DOB:02-07-1951, 73 y.o., female Today's Date: 12/31/2023  END OF SESSION:   OT End of Session - 12/31/23 1323     Visit Number 1    Number of Visits 36    Date for Recertification  03/29/24    OT Start Time 0105    OT Stop Time 0215    OT Time Calculation (min) 70 min    Activity Tolerance Patient tolerated treatment well;No increased pain    Behavior During Therapy WFL for tasks assessed/performed           Past Medical History:  Diagnosis Date   Aortic atherosclerosis    CKD (chronic kidney disease)    DDD (degenerative disc disease), cervical    DDD (degenerative disc disease), lumbar    Depression    Diverticulosis    Dizziness    GERD (gastroesophageal reflux disease)    HTN (hypertension)    Hyperlipidemia    IBS (irritable bowel syndrome)    Memory change    Paresthesia of both hands    Sciatica    Past Surgical History:  Procedure Laterality Date   APPENDECTOMY  2011   COLONOSCOPY  12/12/2012   DILATION AND CURETTAGE OF UTERUS     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   PCP: Elspeth Messier, MD  REFERRING PROVIDER: same  REFERRING DIAG: I87.0  THERAPY DIAG:  Lymphedema, not elsewhere classified  Rationale for Evaluation and Treatment: Rehabilitation  ONSET DATE: onset  Sept 2 years ago when I turned 71. No known precipitating event/s  SUBJECTIVE:                                                                                                                                                                                           SUBJECTIVE STATEMENT:  PERTINENT HISTORY: TAKES AMLODIPINE, CKD, HTN, Sciatica, Family hx of leg swelling in brother  PAIN:  Are you having pain? Yes: NPRS scale: 3/10 Pain  location: B legs Pain description: tightness, heaviness, itchy Aggravating factors: don't know Relieving factors: elevation  PRECAUTIONS: Fall and Other: LYMPHEDEMA   WEIGHT BEARING RESTRICTIONS: none  FALLS:  Has patient fallen in last 6 months? Yes. Number of falls 3 Feels dizzy and off balance when standing up too fast. Sometimes I fall.  LIVING ENVIRONMENT: Lives with: lives with their spouse Lives in: House/apartment Stairs: 5-6 steps back of home and 3 front steps. Rail on the front. 6-8  Steps on the back with a rail.  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 Tai Chi and dance classes both 1 x weekly  HAND DOMINANCE: right   PRIOR LEVEL OF FUNCTION: Independent  PATIENT GOALS: For my legs to be slimmed down    OBJECTIVE: Note: Objective measures were completed at Evaluation unless otherwise noted.  COGNITION:  Overall cognitive status: short term memory difficulties,  On medication, which seems to be helping   OBSERVATIONS / OTHER ASSESSMENTS:   POSTURE: WFL  LE ROM: WFL  LE MMT: WFL  LYMPHEDEMA ASSESSMENTS:   SURGERY TYPE/DATE: N/A, non-cancer related  INFECTIONS: No hx in legs;  Cellulitis 2 x in L lymph edematous arm  HX WOUNDS: denies  LYMPHEDEMA ASSESSMENTS:  BLE COMPARATIVE LIMB VOLUMETRICS TBA OT Rx 1  LANDMARK RIGHT  10/25/21  R LEG (A-D) N/A  R THIGH (E-G) ml  R FULL LIMB (A-G) ml  Limb Volume differential (LVD)  %  Volume change since initial %  Volume change overall V  (Blank rows = not tested)  LANDMARK LEFT  10/25/21  R LEG (A-D) N/A  R THIGH (E-G) ml  R FULL LIMB (A-G) ml  Limb Volume differential (LVD)  %  Volume change since initial %  Volume change overall %  (Blank rows = not tested)    Mild, Stage  II, Bilateral Lower  Extremity Lymphedema 2/2 unknown etiology  Skin  Description Hyper-Keratosis Peau d' Orange Shiny Tight Fibrotic/ Indurated Fatty Doughy Spongy/ boggy       R>L x  x   Skin dry Flaky WNL Macerated   mildly      Color Redness Varicosities Blanching Hemosiderin Stain Mottled        x   Odor Malodorous Yeast Fungal infection  WNL      x   Temperature Warm Cool wnl     x    Pitting Edema   1+ 2+ 3+ 4+ Non-pitting         x   Girth Symmetrical Asymmetrical   x    Stemmer Sign Positive Negative    x   Lymphorrhea History Of:  Present Absent     x    Wounds History Of Present Absent Venous Arterial Pressure Sheer     x        Signs of Infection Erythema Acute Swelling Drainage Obvious Borders                Sensation Light Touch Deep pressure Hypersensitivity   In tact Impaired In tact Impaired Absent Impaired   x  x  x     Nails WNL   Fungus nail dystrophy   x     Hair Growth Symmetrical Asymmetrical   x    Skin Creases Base of toes  Ankles   Base of Fingers knees       Abdominal pannus Thigh Lobules  Face/neck              GAIT: Distance walked: > 500' Assistive device utilized: None Level of assistance: Complete Independence Comments: slowed pace  LYMPHEDEMA LIFE IMPACT SCALE (LLIS):TBA  TREATMENT THIS DATE: OT Initial evaluation Pt and family edu   PATIENT EDUCATION:  Discussed differential diagnoses for various swelling disorders. Provided basic level education regarding lymphatic structure and function, etiology, onset patterns, stages of progression, and prevention to limit infection risk, worsening condition and further functional decline. Pt edu for aught interaction between blood circulatory system and lymphatic circulation.Discussed  impact of gravity and co-morbidities on lymphatic function. Outlined Complete  Decongestive Therapy (CDT)  as standard of care and provided in depth information regarding 4 primary components of Intensive and Self Management Phases, including Manual Lymph Drainage (MLD), compression wrapping and garments, skin care, and therapeutic exercise. Dempsey discussion with re need for frequent attendance and high burden of care when caregiver is needed, impact of co morbidities. We discussed  the chronic, progressive nature of lymphedema and Importance of daily, ongoing LE self-care essential for limiting progression and infection risk.  Person educated: Patient  Education method: Explanation, Demonstration, and Handouts Education comprehension: verbalized understanding, returned demonstration, verbal cues required, and needs further education   LYMPHEDEMA SELF-CARE HOME PROGRAM: BLE lymphatic pumping there ex- 1 set of 10 each element, in order. Hold 5. 2 x daily 2. Fit with appropriate medical grade compression garments providing adequate compression, containment and comfort to control chronic leg swelling and limit LE progression. Custom-made gradient compression garments and HOS devices may be medically necessary because they are uniquely sized and shaped to fit the exact dimensions of the affected extremities, and to provide appropriate medical grade, graduated compression essential for optimally managing chronic, progressive lymphedema. Multiple custom compression garments are needed to ensure proper hygiene to limit infection risk. Custom compression garments should be replaced q 3-6 months When worn consistently for optimal lymphedema self-management over time. HOS devices, medically necessary to limit fibrosis buildup in tissue, should be replaced q 2 years and PRN when worn out.   D 3. Daily skin care with low ph lotion matching skin ph to limit infection risk 4. Daily simple self MLD     ASSESSMENT: CLINICAL IMPRESSION: Jody Russell presents with very mild, stage II, BLE,  lymphedema 2/2 unknown etiology, but I suspect lipedema may be a contributing factor given the grainy nature and palpable fatty fibrosis under the skin. Other signs of lipedema include symmetrical presentation and feet are spared. Lymphedema limits Pt's functional performance in most occupational domains.including functional ambulation and mobility, basic and instrumental ADLs, productive activities, leisure pursuits, and social participation. Because presentation is so mild , and because pt lives so far from the clinic, and because lymphedema offering BLE , non-cancer related lymphedema Complete Decongestive Therapy (CDT), Pt agrees with plan to try compression garment initially instead of completing a labor intensive, high burden of care Intensive Phase treatment program. If the garments are not effective we will re evaluate and commence with CDT.   OBJECTIVE IMPAIRMENTS: decreased activity tolerance, decreased balance, decreased cognition, decreased knowledge of condition, decreased knowledge of use of DME, decreased safety awareness, dizziness, increased edema, pain, and chronic leg swelling and associated pain.   ACTIVITY LIMITATIONS:Functional ambulation and mobility ( impaired standing, walking and dependent sitting tolerance, squatting, stairs, transfers), impaired basic and instrumental ADLs (cooking, meal prep, shopping, house cleaning/ home making, driving, lower body dressing/ fitting clothing) , Productive activities (caring for others) leisure activities (exercise) social participation with friends and family   PERSONAL FACTORS: mild severity, distance Pt lives from clinic, 3+ co morbidities/ contributing factors: CKD, HTN,  are also affecting patient's functional outcome, caregiver availability and willingness  to assist w compression  REHAB POTENTIAL: Fair ; poor without caregiver assistance  CLINICAL DECISION MAKING: Stable/uncomplicated  EVALUATION COMPLEXITY: Low   GOALS: Goals  reviewed with patient? Yes   SHORT TERM GOALS: Target date: 6th OT Rx visit    Pt and caregiver will demonstrate understanding of lymphedema precautions and prevention strategies with modified independence using a printed reference to identify at least 3 precautions and discussing how s/he may implement them into daily life to reduce risk of progression with modified assistance Baseline: max a Goal status: INITIAL   2.  With min caregiver assistance and assistive devices PRN, Pt will be able to apply BLE, knee length, custom, gradient, compression stockings, consider flat knit medical grade,  ccl 1 ( 8-21 mmHg) or flat knit ccl 2 (23-32 mmHg)  to decrease limb volume, to limit infection risk, and to limit lymphedema progression.   Baseline: Dependent Goal status: INITIAL PLAN:   OT FREQUENCY: 6 its and PRN   OT DURATION: 4-12 weeks, depending on garment funding availability and delivery date from DME provider.   PLANNED INTERVENTIONS: Patient/Family education for LE Self Care, DME instructions   PLAN FOR NEXT SESSION:  Identify appropriate compression garments and complete measurements Pt/ family edu  Zebedee Dec, MS, OTR/L, CLT-LANA 12/31/23 1:25 PM

## 2023-12-31 ENCOUNTER — Encounter: Payer: Self-pay | Admitting: Occupational Therapy

## 2023-12-31 NOTE — Addendum Note (Signed)
 Addended by: MYRIAM ZEBEDEE CROME on: 12/31/2023 02:31 PM   Modules accepted: Orders

## 2024-01-01 ENCOUNTER — Ambulatory Visit: Admitting: Occupational Therapy

## 2024-01-01 DIAGNOSIS — I890 Lymphedema, not elsewhere classified: Secondary | ICD-10-CM | POA: Diagnosis not present

## 2024-01-01 NOTE — Therapy (Signed)
 OUTPATIENT OCCUPATIONAL THERAPY EVALUATION  BILATERAL LOWER EXTREMITY/ BILATERAL LOWER QUADRANT LYMPHEDEMA  Patient Name: Jody Russell MRN: [id number] DOB:Aug 17, 1950, 73 y.o., female Today's Date: 01/01/2024  END OF SESSION:   OT End of Session - 01/01/24 1415     Visit Number 2    Number of Visits 6    Date for Recertification  03/29/24    OT Start Time 0205    OT Stop Time 0305    OT Time Calculation (min) 60 min    Activity Tolerance Patient tolerated treatment well;No increased pain    Behavior During Therapy WFL for tasks assessed/performed           Past Medical History:  Diagnosis Date   Aortic atherosclerosis    CKD (chronic kidney disease)    DDD (degenerative disc disease), cervical    DDD (degenerative disc disease), lumbar    Depression    Diverticulosis    Dizziness    GERD (gastroesophageal reflux disease)    HTN (hypertension)    Hyperlipidemia    IBS (irritable bowel syndrome)    Memory change    Paresthesia of both hands    Sciatica    Past Surgical History:  Procedure Laterality Date   APPENDECTOMY  2011   COLONOSCOPY  12/12/2012   DILATION AND CURETTAGE OF UTERUS     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   PCP: Elspeth Messier, MD  REFERRING PROVIDER: same  REFERRING DIAG: I87.0  THERAPY DIAG:  Lymphedema, not elsewhere classified  Rationale for Evaluation and Treatment: Rehabilitation  ONSET DATE: onset  Sept 2 years ago when I turned 73. No known precipitating event/s  SUBJECTIVE:                                                                                                                                                                                           SUBJECTIVE STATEMENT:  PERTINENT HISTORY: TAKES AMLODIPINE, CKD, HTN, Sciatica, Family hx of leg swelling in brother  PAIN:  Are you having pain? Yes: NPRS scale: 3/10 Pain  location: B legs Pain description: tightness, heaviness, itchy Aggravating factors: don't know Relieving factors: elevation  PRECAUTIONS: Fall and Other: LYMPHEDEMA   WEIGHT BEARING RESTRICTIONS: none  FALLS:  Has patient fallen in last 6 months? Yes. Number of falls 3 Feels dizzy and off balance when standing up too fast. Sometimes I fall.  LIVING ENVIRONMENT: Lives with: lives with their spouse Lives in: House/apartment Stairs: 5-6 steps back of home and 3 front steps. Rail on the front. 6-8  Steps on the back with a rail.  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 Tai Chi and dance classes both 1 x weekly  HAND DOMINANCE: right   PRIOR LEVEL OF FUNCTION: Independent  PATIENT GOALS: For my legs to be slimmed down    OBJECTIVE: Note: Objective measures were completed at Evaluation unless otherwise noted.  COGNITION:  Overall cognitive status: short term memory difficulties,  On medication, which seems to be helping   OBSERVATIONS / OTHER ASSESSMENTS:   POSTURE: WFL  LE ROM: WFL  LE MMT: WFL  LYMPHEDEMA ASSESSMENTS:   SURGERY TYPE/DATE: N/A, non-cancer related  INFECTIONS: No hx in legs;  Cellulitis 2 x in L lymph edematous arm  HX WOUNDS: denies  LYMPHEDEMA ASSESSMENTS:  BLE COMPARATIVE LIMB VOLUMETRICS TBA OT Rx 1  LANDMARK RIGHT  10/25/21  R LEG (A-D) N/A  R THIGH (E-G) ml  R FULL LIMB (A-G) ml  Limb Volume differential (LVD)  %  Volume change since initial %  Volume change overall V  (Blank rows = not tested)  LANDMARK LEFT  10/25/21  R LEG (A-D) N/A  R THIGH (E-G) ml  R FULL LIMB (A-G) ml  Limb Volume differential (LVD)  %  Volume change since initial %  Volume change overall %  (Blank rows = not tested)    Mild, Stage  II, Bilateral Lower  Extremity Lymphedema 2/2 unknown etiology  Skin  Description Hyper-Keratosis Peau d' Orange Shiny Tight Fibrotic/ Indurated Fatty Doughy Spongy/ boggy       R>L x  x   Skin dry Flaky WNL Macerated   mildly      Color Redness Varicosities Blanching Hemosiderin Stain Mottled        x   Odor Malodorous Yeast Fungal infection  WNL      x   Temperature Warm Cool wnl     x    Pitting Edema   1+ 2+ 3+ 4+ Non-pitting         x   Girth Symmetrical Asymmetrical   x    Stemmer Sign Positive Negative    x   Lymphorrhea History Of:  Present Absent     x    Wounds History Of Present Absent Venous Arterial Pressure Sheer     x        Signs of Infection Erythema Acute Swelling Drainage Obvious Borders                Sensation Light Touch Deep pressure Hypersensitivity   In tact Impaired In tact Impaired Absent Impaired   x  x  x     Nails WNL   Fungus nail dystrophy   x     Hair Growth Symmetrical Asymmetrical   x    Skin Creases Base of toes  Ankles   Base of Fingers knees       Abdominal pannus Thigh Lobules  Face/neck              GAIT: Distance walked: > 500' Assistive device utilized: None Level of assistance: Complete Independence Comments: slowed pace  LYMPHEDEMA LIFE IMPACT SCALE (LLIS): 36.76% (The extent to which lymphedema -related problems impacted your life in the past week.)  TREATMENT THIS DATE: BLE custom compression garment measurements Pt and family edu   PATIENT EDUCATION:  Discussed differential diagnoses for various swelling disorders. Provided basic level education regarding lymphatic structure and function, etiology, onset patterns, stages of progression, and prevention to limit infection risk, worsening condition and further functional decline. Pt edu for aught interaction between blood circulatory system  and lymphatic circulation.Discussed  impact of gravity and co-morbidities on lymphatic function. Outlined Complete Decongestive Therapy (CDT)  as standard of care and provided in depth information regarding 4 primary components of Intensive and Self Management Phases, including Manual Lymph Drainage (MLD), compression wrapping and garments, skin care, and therapeutic exercise. Dempsey discussion with re need for frequent attendance and high burden of care when caregiver is needed, impact of co morbidities. We discussed  the chronic, progressive nature of lymphedema and Importance of daily, ongoing LE self-care essential for limiting progression and infection risk.  Person educated: Patient  Education method: Explanation, Demonstration, and Handouts Education comprehension: verbalized understanding, returned demonstration, verbal cues required, and needs further education   LYMPHEDEMA SELF-CARE HOME PROGRAM: BLE lymphatic pumping there ex- 1 set of 10 each element, in order. Hold 5. 2 x daily 2. Fit with appropriate medical grade compression garments providing adequate compression, containment and comfort to control chronic leg swelling and limit LE progression.  3. Daily skin care with low ph lotion matching skin ph to limit infection risk 4. Daily simple self MLD     ASSESSMENT: CLINICAL IMPRESSION: Completed anatomical measurements for BLE custom compression knee highs after providing Pt edu on compression garment options and recommendations. Pt will be fit with:  BLE, custom, Elvarex Classic , knee length, flat knit ccl 2 ( 23-32 mmHg) compression stockings with T heel. Open toes and oblique top edge, silicone top band.  Custom-made gradient compression garments and HOS devices may be medically necessary because they are uniquely sized and shaped to fit the exact dimensions of the affected extremities, and to provide appropriate medical grade, graduated compression essential for optimally managing  chronic, progressive lymphedema. Multiple custom compression garments are needed to ensure proper hygiene to limit infection risk. Custom compression garments should be replaced q 3-6 months When worn consistently for optimal lymphedema self-management over time. HOS devices, medically necessary to limit fibrosis buildup in tissue, should be replaced q 2 years and PRN when worn out.   Pt agrees to return for fityting appointment to ensure optimal fit and function. Cont as per POC.  (INITIAL OT EVAL 12/30/23: Triva Hueber presents with very mild, stage II, BLE, lymphedema 2/2 unknown etiology, but I suspect lipedema may be a contributing factor given the grainy nature and palpable fatty fibrosis under the skin. Other signs of lipedema include symmetrical presentation and feet are spared. Lymphedema limits Pt's functional performance in most occupational domains.including functional ambulation and mobility, basic and instrumental ADLs, productive activities, leisure pursuits, and social participation. Because presentation is so mild , and because pt lives so far from the clinic, and because lymphedema offering BLE , non-cancer related lymphedema Complete Decongestive Therapy (CDT), Pt agrees with plan to try compression garment initially instead of completing a labor intensive, high burden of care Intensive Phase treatment program. If the garments are not effective we will re evaluate and commence with CDT. )  OBJECTIVE IMPAIRMENTS: decreased activity tolerance, decreased balance, decreased cognition, decreased knowledge of condition, decreased knowledge of use of DME, decreased safety awareness, dizziness, increased edema, pain, and chronic leg swelling and associated pain.   ACTIVITY  LIMITATIONS:Functional ambulation and mobility ( impaired standing, walking and dependent sitting tolerance, squatting, stairs, transfers), impaired basic and instrumental ADLs (cooking, meal prep, shopping, house cleaning/ home  making, driving, lower body dressing/ fitting clothing) , Productive activities (caring for others) leisure activities (exercise) social participation with friends and family   PERSONAL FACTORS: mild severity, distance Pt lives from clinic, 3+ co morbidities/ contributing factors: CKD, HTN,  are also affecting patient's functional outcome, caregiver availability and willingness to assist w compression  REHAB POTENTIAL: Fair ; poor without caregiver assistance  CLINICAL DECISION MAKING: Stable/uncomplicated  EVALUATION COMPLEXITY: Low   GOALS: Goals reviewed with patient? Yes   SHORT TERM GOALS: Target date: 6th OT Rx visit    Pt and caregiver will demonstrate understanding of lymphedema precautions and prevention strategies with modified independence using a printed reference to identify at least 3 precautions and discussing how s/he may implement them into daily life to reduce risk of progression with modified assistance Baseline: max a Goal status: INITIAL   2.  With min caregiver assistance and assistive devices PRN, Pt will be able to apply BLE, knee length, custom, gradient, compression stockings, consider flat knit medical grade,  ccl 1 ( 8-21 mmHg) or flat knit ccl 2 (23-32 mmHg)  to decrease limb volume, to limit infection risk, and to limit lymphedema progression.   Baseline: Dependent Goal status: INITIAL PLAN:   OT FREQUENCY: 6 its and PRN   OT DURATION: 4-12 weeks, depending on garment funding availability and delivery date from DME provider.   PLANNED INTERVENTIONS: Patient/Family education for LE Self Care, DME instructions   PLAN FOR NEXT SESSION:  Identify appropriate compression garments and complete measurements Pt/ family edu  Zebedee Dec, MS, OTR/L, CLT-LANA 01/01/24 4:02 PM

## 2024-01-07 ENCOUNTER — Ambulatory Visit: Admitting: Occupational Therapy

## 2024-01-09 ENCOUNTER — Ambulatory Visit: Admitting: Occupational Therapy

## 2024-01-13 ENCOUNTER — Encounter: Payer: Self-pay | Admitting: Adult Health

## 2024-01-13 ENCOUNTER — Ambulatory Visit: Admitting: Adult Health

## 2024-01-13 VITALS — BP 152/76 | HR 59 | Ht 61.0 in | Wt 146.0 lb

## 2024-01-13 DIAGNOSIS — R42 Dizziness and giddiness: Secondary | ICD-10-CM

## 2024-01-13 DIAGNOSIS — G3184 Mild cognitive impairment, so stated: Secondary | ICD-10-CM

## 2024-01-13 MED ORDER — MEMANTINE HCL 10 MG PO TABS: 10.0000 mg | ORAL_TABLET | Freq: Two times a day (BID) | ORAL | 3 refills | Status: AC

## 2024-01-13 NOTE — Patient Instructions (Addendum)
 Your Plan:  Continue Namenda  10mg  twice daily for cognitive impairment   Please call with any worsening memory concerns       Follow up in 1 year or call earlier if needed      Thank you for coming to see us  at [facility]. I hope we have been able to provide you high quality care today.  You may receive a patient satisfaction survey over the next few weeks. We would appreciate your feedback and comments so that we may continue to improve ourselves and the health of our patients.      Orthostatic Hypotension Blood pressure is a measurement of how strongly, or weakly, your circulating blood is pressing against the walls of your arteries. Orthostatic hypotension is a drop in blood pressure that can happen when you change positions, such as when you go from lying down to standing. Arteries are blood vessels that carry blood from your heart throughout your body. When blood pressure is too low, you may not get enough blood to your brain or to the rest of your organs. Orthostatic hypotension can cause light-headedness, sweating, rapid heartbeat, blurred vision, and fainting. These symptoms require further investigation into the cause. What are the causes? Orthostatic hypotension can be caused by many things, including: Sudden changes in posture, such as standing up quickly after you have been sitting or lying down. Loss of blood (anemia) or loss of body fluids (dehydration). Heart problems, neurologic problems, or hormone problems. Pregnancy. Aging. The risk for this condition increases as you get older. Severe infection (sepsis). Certain medicines, such as medicines for high blood pressure or medicines that make the body lose excess fluids (diuretics). What are the signs or symptoms? Symptoms of this condition may include: Weakness, light-headedness, or dizziness. Sweating. Blurred vision. Tiredness (fatigue). Rapid heartbeat. Fainting, in severe cases. How is  this diagnosed? This condition is diagnosed based on: Your symptoms and medical history. Your blood pressure measurements. Your health care provider will check your blood pressure when you are: Lying down. Sitting. Standing. A blood pressure reading is recorded as two numbers, such as 120 over 80 (or 120/80). The first (top) number is called the systolic pressure. It is a measure of the pressure in your arteries as your heart beats. The second (bottom) number is called the diastolic pressure. It is a measure of the pressure in your arteries when your heart relaxes between beats. Blood pressure is measured in a unit called mmHg. Healthy blood pressure for most adults is 120/80 mmHg. Orthostatic hypotension is defined as a 20 mmHg drop in systolic pressure or a 10 mmHg drop in diastolic pressure within 3 minutes of standing. Other information or tests that may be used to diagnose orthostatic hypotension include: Your other vital signs, such as your heart rate and temperature. Blood tests. An electrocardiogram (ECG) or echocardiogram. A Holter monitor. This is a device you wear that records your heart rhythm continuously, usually for 24-48 hours. Tilt table test. For this test, you will be safely secured to a table that moves you from a lying position to an upright position. Your heart rhythm and blood pressure will be monitored during the test. How is this treated? This condition may be treated by: Changing your diet. This may involve eating more salt (sodium) or drinking more water. Changing the dosage of certain medicines you are taking that might be lowering your blood pressure. Correcting the underlying reason for the orthostatic hypotension. Wearing compression stockings. Taking medicines to raise  your blood pressure. Avoiding actions that trigger symptoms. Follow these instructions at home: Medicines Take over-the-counter and prescription medicines only as told by your health care  provider. Follow instructions from your health care provider about changing the dosage of your current medicines, if this applies. Do not stop or adjust any of your medicines on your own. Eating and drinking  Drink enough fluid to keep your urine pale yellow. Eat extra salt only as directed. Do not add extra salt to your diet unless advised by your health care provider. Eat frequent, small meals. Avoid standing up suddenly after eating. General instructions  Get up slowly from lying down or sitting positions. This gives your blood pressure a chance to adjust. Avoid hot showers and excessive heat as directed by your health care provider. Engage in regular physical activity as directed by your health care provider. If you have compression stockings, wear them as told. Keep all follow-up visits. This is important. Contact a health care provider if: You have a fever for more than 2-3 days. You feel more thirsty than usual. You feel dizzy or weak. Get help right away if: You have chest pain. You have a fast or irregular heartbeat. You become sweaty or feel light-headed. You feel short of breath. You faint. You have any symptoms of a stroke. BE FAST is an easy way to remember the main warning signs of a stroke: B - Balance. Signs are dizziness, sudden trouble walking, or loss of balance. E - Eyes. Signs are trouble seeing or a sudden change in vision. F - Face. Signs are sudden weakness or numbness of the face, or the face or eyelid drooping on one side. A - Arms. Signs are weakness or numbness in an arm. This happens suddenly and usually on one side of the body. S - Speech. Signs are sudden trouble speaking, slurred speech, or trouble understanding what people say. T - Time. Time to call emergency services. Write down what time symptoms started. You have other signs of a stroke, such as: A sudden, severe headache with no known cause. Nausea or vomiting. Seizure. These symptoms may  represent a serious problem that is an emergency. Do not wait to see if the symptoms will go away. Get medical help right away. Call your local emergency services (911 in the [place] Do not drive yourself to the hospital. Summary Orthostatic hypotension is a sudden drop in blood pressure. It can cause light-headedness, sweating, rapid heartbeat, blurred vision, and fainting. Orthostatic hypotension can be diagnosed by having your blood pressure taken while lying down, sitting, and then standing. Treatment may involve changing your diet, wearing compression stockings, sitting up slowly, adjusting your medicines, or correcting the underlying reason for the orthostatic hypotension. Get help right away if you have chest pain, a fast or irregular heartbeat, or symptoms of a stroke. This information is not intended to replace advice given to you by your health care provider. Make sure you discuss any questions you have with your health care provider. Document Revised: 05/05/2020 Document Reviewed: 05/05/2020 Elsevier Patient Education  2024 [employer].

## 2024-01-13 NOTE — Progress Notes (Addendum)
 [facility] [address] [place]. [place] [address] [phone]       OFFICE FOLLOW UP NOTE  Ms. Lawonda Pretlow Date of Birth:  Mar 24, 1950 Medical Record Number:  [id number]    Primary neurologist: Dr. Buck Reason for visit: [facility]    SUBJECTIVE:   CHIEF COMPLAINT:  Chief Complaint  Patient presents with   Memory Loss    Rm 8 with spouse Pt is well and stable, reports memory has slightly improved since last visit. No new concerns.     Follow-up visit:  Prior visit: 06/26/2023  Brief HPI:   Channel Papandrea is a 73 y.o. female who was initially evaluated by Dr. Buck on 01/22/2022 for complaints of several month history of forgetfulness and confusion at times.  Reported family history of Alzheimer's dementia affecting her mother.  She also mention falls and imbalance.  Longstanding history of cervical and lumbar issues felt to be contributing to gait impairment/imbalance.  MRI brain 08/2021 chronic mild small vessel ischemia.  Lab work in 12/2021 showed abnormal liver function, worsening of kidney impairment and anemia. Was advised to be seen by nephrology.  Also advised to follow-up with OB/GYN for dysfunctional uterine bleeding as well as ongoing follow-up with neurosurgery.  Recommended obtaining B12, A1c and hepatitis profile at follow-up visit with PCP.   At prior visit, reported overall cognition had been stable.  She wished to further pursue Alzheimer's workup, ATN profile showed elevated P tau 181 (3.67) and elevated NfL (8.42). She was referred to neuropsychology for further evaluation.      Interval history:  Returns today for follow up visit accompanied by her husband. Both patient and husband believe memory overall has been stable. MMSE 26/30 (prior 27/30). Husband does not short term memory difficulties at times but no significant worsening over the past 6 months. Continues to maintain ADLs independently. She remains in Namneda 10mg  BID. She did not  pursue cognitive evaluation as she did not feel it was needed at that time. She continues to be active, participates in dance class. Does not do any routine memory exercises. Sleeps well, good appetite.   Does report continued chronic gait impairment and occasional dizziness, more so with position changes. Reports drop in blood pressure when going from laying to sitting while at nephrology office. Does not routinely monitor blood pressure at home, normally is higher at appointments.       ROS:   14 system review of systems performed and negative with exception of those listed in HPI  PMH:  Past Medical History:  Diagnosis Date   Aortic atherosclerosis    CKD (chronic kidney disease)    DDD (degenerative disc disease), cervical    DDD (degenerative disc disease), lumbar    Depression    Diverticulosis    Dizziness    GERD (gastroesophageal reflux disease)    HTN (hypertension)    Hyperlipidemia    IBS (irritable bowel syndrome)    Memory change    Paresthesia of both hands    Sciatica     PSH:  Past Surgical History:  Procedure Laterality Date   APPENDECTOMY  2011   COLONOSCOPY  12/12/2012   DILATION AND CURETTAGE OF UTERUS     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    Social History:  Social History   Socioeconomic History   Marital status: Married    Spouse name: Sherwood   Number of children: 2   Years of education: Not on  file   Highest education level: Not on file  Occupational History    Comment: retired  Tobacco Use   Smoking status: Never   Smokeless tobacco: Never  Substance and Sexual Activity   Alcohol use: Never   Drug use: Never   Sexual activity: Not on file  Other Topics Concern   Not on file  Social History Narrative   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   Social Drivers of Corporate Investment Banker Strain: Not on file  Food Insecurity: Not on file  Transportation Needs: Not on file  Physical Activity: Not on file   Stress: Not on file  Social Connections: Not on file  Intimate Partner Violence: Not At Risk (07/14/2020)   Received from [facility]   Humiliation, Afraid, Rape, and Kick questionnaire    Within the last year, have you been afraid of your partner or ex-partner?: No    Within the last year, have you been humiliated or emotionally abused in other ways by your partner or ex-partner?: No    Within the last year, have you been kicked, hit, slapped, or otherwise physically hurt by your partner or ex-partner?: No    Within the last year, have you been raped or forced to have any kind of sexual activity by your partner or ex-partner?: No    Family History:  Family History  Problem Relation Age of Onset   Alzheimer's disease Mother    Kidney failure Father    Cervical cancer Sister    Diabetes Brother    Kidney disease Brother    Ovarian cancer Other    Colon cancer Other     Medications:   Current Outpatient Medications on File Prior to Visit  Medication Sig Dispense Refill   amLODipine (NORVASC) 5 MG tablet Take 5 mg by mouth daily.     Calcium Citrate-Vitamin D (CALCIUM + D PO) Take by mouth.     FLUoxetine (PROZAC) 40 MG capsule Take 40 mg by mouth daily.     memantine  (NAMENDA ) 10 MG tablet Take 1 tablet (10 mg total) by mouth 2 (two) times daily. 180 tablet 3   memantine  (NAMENDA ) 10 MG tablet Take 1 tablet (10 mg total) by mouth 2 (two) times daily. 180 tablet 1   metoprolol succinate (TOPROL-XL) 25 MG 24 hr tablet Take 25 mg by mouth daily.     omeprazole (PRILOSEC) 20 MG capsule Take 20 mg by mouth 2 (two) times daily.     rosuvastatin (CRESTOR) 10 MG tablet Take 10 mg by mouth daily.     No current facility-administered medications on file prior to visit.    Allergies:   Allergies  Allergen Reactions   Lisinopril     Other reaction(s): Other (See Comments) Elevated potassium level   Statins     Other reaction(s): Muscle Pain Joint and muscle aches       OBJECTIVE:  Physical Exam  Vitals:   01/13/24 1245 01/13/24 1248  BP: (!) 160/78 (!) 152/76  Pulse: (!) 59   Weight: 146 lb (66.2 kg)   Height: 5' 1 (1.549 m)    Body mass index is 27.59 kg/m. No results found.  General: well developed, well nourished, very pleasant elderly Caucasian female, seated, in no evident distress  Neurologic Exam Mental Status: Awake and fully alert. Oriented to place and time. Recent memory impaired and remote memory intact. Attention span, concentration and fund of knowledge mostly appropriate but will repeat questions or ask for clarification. Mood  and affect appropriate.  Cranial Nerves: Pupils equal, briskly reactive to light. Extraocular movements full without nystagmus. Visual fields full to confrontation. Hearing intact. Facial sensation intact. Face, tongue, palate moves normally and symmetrically.  Motor: Normal bulk and tone. Normal strength in all tested extremity muscles Sensory.: intact to touch , pinprick , position and vibratory sensation.  Coordination: Rapid alternating movements normal in all extremities. Finger-to-nose and heel-to-shin performed accurately bilaterally. Gait and Station: Arises from chair without difficulty. Stance is normal. Gait demonstrates adequate stride length and balance without use of AD, mild imbalance with turns Reflexes: 1+ and symmetric. Toes downgoing.       06/26/2023    1:03 PM 10/15/2022    3:12 PM 05/09/2022   10:55 AM  MMSE - Mini Mental State Exam  Orientation to time 5 4 4   Orientation to Place 5 5 5   Registration 3 3 3   Attention/ Calculation 2 3 3   Recall 3 3 3   Language- name 2 objects 2 2 2   Language- repeat 1 1 1   Language- follow 3 step command 3 2 2   Language- read & follow direction 1 1 1   Write a sentence 1 1 1   Copy design 1 0 1  Total score 27 25 26          ASSESSMENT/PLAN: Namita Yearwood is a 73 y.o. year old female with progressive memory loss since mid 2023.      Mild cognitive impairment: MMSE today 27/30 (prior 27/30) Continue memantine  10 mg twice daily - refill provided ATN profile elevated p tau 181 and NfL indicating neurodegenerative disease, normal beta amyloid ratio lowering suspicion for Alzheimer disease. No behavioral concerns to suggest FTD.  Possibly vascular, discussed importance of ensuring vascular risk factor management   Declines interest in pursuing neurocognitive evaluation at this time - can place new referral if interested in pursuing MRI brain 08/2021 mild chronic small vessel ischemia in the hemispheric white matter, age normal brain volume Encouraged increasing memory exercises at home as well as staying physically active  Dizziness Intermittent with position changes Suspect more due to orthostatic hypotension - discussed conversant measures at home. Continue to follow with PCP and cardiology.      Follow up in 1 year or call earlier if needed   CC:  PCP: Burdine, Elspeth BRAVO, MD      Harlene Bogaert, AGNP-BC  [facility] [address] [place], [place] [address]  Phone [phone] Fax [phone] Note: This document was prepared with digital dictation and possible smart phrase technology. Any transcriptional errors that result from this process are unintentional.

## 2024-01-15 ENCOUNTER — Ambulatory Visit: Admitting: Occupational Therapy

## 2024-01-15 DIAGNOSIS — N1832 Chronic kidney disease, stage 3b: Secondary | ICD-10-CM | POA: Diagnosis not present

## 2024-01-17 ENCOUNTER — Ambulatory Visit: Admitting: Occupational Therapy

## 2024-01-20 DIAGNOSIS — I890 Lymphedema, not elsewhere classified: Secondary | ICD-10-CM | POA: Diagnosis not present

## 2024-01-20 DIAGNOSIS — N1832 Chronic kidney disease, stage 3b: Secondary | ICD-10-CM | POA: Diagnosis not present

## 2024-01-20 DIAGNOSIS — D631 Anemia in chronic kidney disease: Secondary | ICD-10-CM | POA: Diagnosis not present

## 2024-01-20 DIAGNOSIS — I129 Hypertensive chronic kidney disease with stage 1 through stage 4 chronic kidney disease, or unspecified chronic kidney disease: Secondary | ICD-10-CM | POA: Diagnosis not present

## 2024-01-20 DIAGNOSIS — N2581 Secondary hyperparathyroidism of renal origin: Secondary | ICD-10-CM | POA: Diagnosis not present

## 2024-01-21 ENCOUNTER — Ambulatory Visit: Admitting: Occupational Therapy

## 2024-01-23 ENCOUNTER — Ambulatory Visit: Admitting: Occupational Therapy

## 2024-01-27 DIAGNOSIS — R739 Hyperglycemia, unspecified: Secondary | ICD-10-CM | POA: Diagnosis not present

## 2024-01-27 DIAGNOSIS — E7800 Pure hypercholesterolemia, unspecified: Secondary | ICD-10-CM | POA: Diagnosis not present

## 2024-01-27 DIAGNOSIS — N184 Chronic kidney disease, stage 4 (severe): Secondary | ICD-10-CM | POA: Diagnosis not present

## 2024-01-27 DIAGNOSIS — Z0001 Encounter for general adult medical examination with abnormal findings: Secondary | ICD-10-CM | POA: Diagnosis not present

## 2024-01-27 DIAGNOSIS — Z1329 Encounter for screening for other suspected endocrine disorder: Secondary | ICD-10-CM | POA: Diagnosis not present

## 2024-01-28 ENCOUNTER — Ambulatory Visit: Admitting: Occupational Therapy

## 2024-02-04 ENCOUNTER — Ambulatory Visit: Admitting: Occupational Therapy

## 2024-02-05 DIAGNOSIS — I890 Lymphedema, not elsewhere classified: Secondary | ICD-10-CM | POA: Diagnosis not present

## 2024-02-07 ENCOUNTER — Ambulatory Visit: Admitting: Occupational Therapy

## 2024-02-11 ENCOUNTER — Ambulatory Visit: Admitting: Occupational Therapy

## 2024-02-13 ENCOUNTER — Ambulatory Visit: Admitting: Occupational Therapy

## 2024-02-18 ENCOUNTER — Ambulatory Visit: Admitting: Occupational Therapy

## 2024-02-20 ENCOUNTER — Ambulatory Visit: Admitting: Occupational Therapy

## 2024-03-12 ENCOUNTER — Ambulatory Visit: Admitting: Occupational Therapy

## 2024-03-18 ENCOUNTER — Encounter: Payer: Self-pay | Admitting: Internal Medicine

## 2024-03-23 ENCOUNTER — Ambulatory Visit: Admitting: Occupational Therapy

## 2024-03-25 ENCOUNTER — Ambulatory Visit: Admitting: Occupational Therapy

## 2024-03-30 ENCOUNTER — Ambulatory Visit: Admitting: Occupational Therapy

## 2024-04-01 ENCOUNTER — Ambulatory Visit: Admitting: Occupational Therapy

## 2024-04-06 ENCOUNTER — Ambulatory Visit: Admitting: Occupational Therapy

## 2024-04-08 ENCOUNTER — Ambulatory Visit: Admitting: Occupational Therapy

## 2024-04-13 ENCOUNTER — Ambulatory Visit: Admitting: Occupational Therapy

## 2024-04-15 ENCOUNTER — Ambulatory Visit: Admitting: Occupational Therapy

## 2024-04-20 ENCOUNTER — Ambulatory Visit: Admitting: Occupational Therapy

## 2024-04-22 ENCOUNTER — Ambulatory Visit: Admitting: Occupational Therapy

## 2024-04-27 ENCOUNTER — Ambulatory Visit: Admitting: Occupational Therapy

## 2024-04-29 ENCOUNTER — Ambulatory Visit: Admitting: Occupational Therapy

## 2024-05-04 ENCOUNTER — Ambulatory Visit: Admitting: Occupational Therapy

## 2024-05-06 ENCOUNTER — Ambulatory Visit: Admitting: Occupational Therapy

## 2024-05-11 ENCOUNTER — Ambulatory Visit: Admitting: Occupational Therapy

## 2024-05-13 ENCOUNTER — Ambulatory Visit: Admitting: Occupational Therapy

## 2024-05-18 ENCOUNTER — Ambulatory Visit: Admitting: Occupational Therapy

## 2024-05-20 ENCOUNTER — Ambulatory Visit: Admitting: Occupational Therapy

## 2024-05-25 ENCOUNTER — Ambulatory Visit: Admitting: Occupational Therapy

## 2024-05-27 ENCOUNTER — Ambulatory Visit: Admitting: Occupational Therapy

## 2025-01-11 ENCOUNTER — Ambulatory Visit: Admitting: Neurology
